# Patient Record
Sex: Male | Born: 1945 | Race: Black or African American | Hispanic: No | Marital: Married | Smoking: Never smoker
Health system: Southern US, Community
[De-identification: ages and names within clinical notes are randomized; demographics above are authoritative.]

## PROBLEM LIST (undated history)

## (undated) DIAGNOSIS — E7800 Pure hypercholesterolemia, unspecified: Secondary | ICD-10-CM

## (undated) DIAGNOSIS — IMO0001 Reserved for inherently not codable concepts without codable children: Secondary | ICD-10-CM

## (undated) DIAGNOSIS — I10 Essential (primary) hypertension: Secondary | ICD-10-CM

## (undated) DIAGNOSIS — N138 Other obstructive and reflux uropathy: Secondary | ICD-10-CM

## (undated) DIAGNOSIS — T7840XA Allergy, unspecified, initial encounter: Secondary | ICD-10-CM

## (undated) DIAGNOSIS — R0602 Shortness of breath: Secondary | ICD-10-CM

## (undated) DIAGNOSIS — N529 Male erectile dysfunction, unspecified: Secondary | ICD-10-CM

## (undated) DIAGNOSIS — C61 Malignant neoplasm of prostate: Secondary | ICD-10-CM

## (undated) DIAGNOSIS — K219 Gastro-esophageal reflux disease without esophagitis: Secondary | ICD-10-CM

## (undated) DIAGNOSIS — I499 Cardiac arrhythmia, unspecified: Secondary | ICD-10-CM

## (undated) DIAGNOSIS — N401 Enlarged prostate with lower urinary tract symptoms: Secondary | ICD-10-CM

## (undated) DIAGNOSIS — M549 Dorsalgia, unspecified: Secondary | ICD-10-CM

## (undated) DIAGNOSIS — IMO0002 Reserved for concepts with insufficient information to code with codable children: Secondary | ICD-10-CM

## (undated) HISTORY — DX: Reserved for concepts with insufficient information to code with codable children: IMO0002

## (undated) HISTORY — DX: Reserved for inherently not codable concepts without codable children: IMO0001

## (undated) HISTORY — PX: CIRCUMCISION: SUR203

---

## 1997-11-13 ENCOUNTER — Ambulatory Visit (HOSPITAL_COMMUNITY): Admission: RE | Admit: 1997-11-13 | Discharge: 1997-11-13 | Payer: Self-pay | Admitting: *Deleted

## 1998-01-03 ENCOUNTER — Ambulatory Visit (HOSPITAL_COMMUNITY): Admission: RE | Admit: 1998-01-03 | Discharge: 1998-01-03 | Payer: Self-pay | Admitting: *Deleted

## 1998-01-03 ENCOUNTER — Encounter: Payer: Self-pay | Admitting: *Deleted

## 1998-09-12 ENCOUNTER — Ambulatory Visit (HOSPITAL_COMMUNITY): Admission: RE | Admit: 1998-09-12 | Discharge: 1998-09-12 | Payer: Self-pay | Admitting: *Deleted

## 1998-09-12 ENCOUNTER — Encounter: Payer: Self-pay | Admitting: *Deleted

## 1998-11-08 ENCOUNTER — Encounter: Payer: Self-pay | Admitting: *Deleted

## 1998-11-08 ENCOUNTER — Ambulatory Visit (HOSPITAL_COMMUNITY): Admission: RE | Admit: 1998-11-08 | Discharge: 1998-11-08 | Payer: Self-pay | Admitting: *Deleted

## 1999-10-02 ENCOUNTER — Ambulatory Visit (HOSPITAL_COMMUNITY): Admission: RE | Admit: 1999-10-02 | Discharge: 1999-10-02 | Payer: Self-pay | Admitting: *Deleted

## 1999-10-02 ENCOUNTER — Encounter: Payer: Self-pay | Admitting: *Deleted

## 2000-04-19 ENCOUNTER — Encounter: Payer: Self-pay | Admitting: *Deleted

## 2000-04-19 ENCOUNTER — Ambulatory Visit (HOSPITAL_COMMUNITY): Admission: RE | Admit: 2000-04-19 | Discharge: 2000-04-19 | Payer: Self-pay | Admitting: *Deleted

## 2005-09-04 ENCOUNTER — Encounter: Admission: RE | Admit: 2005-09-04 | Discharge: 2005-09-04 | Payer: Self-pay | Admitting: Cardiology

## 2011-09-10 ENCOUNTER — Other Ambulatory Visit: Payer: Self-pay | Admitting: Internal Medicine

## 2011-09-10 ENCOUNTER — Encounter (HOSPITAL_COMMUNITY): Payer: Self-pay | Admitting: Emergency Medicine

## 2011-09-10 ENCOUNTER — Inpatient Hospital Stay (HOSPITAL_COMMUNITY)
Admission: EM | Admit: 2011-09-10 | Discharge: 2011-09-12 | DRG: 379 | Disposition: A | Discharge status: Home / Self Care | Payer: 59 | Attending: Internal Medicine | Admitting: Internal Medicine

## 2011-09-10 DIAGNOSIS — D649 Anemia, unspecified: Secondary | ICD-10-CM | POA: Diagnosis present

## 2011-09-10 DIAGNOSIS — K219 Gastro-esophageal reflux disease without esophagitis: Secondary | ICD-10-CM | POA: Diagnosis present

## 2011-09-10 DIAGNOSIS — K449 Diaphragmatic hernia without obstruction or gangrene: Secondary | ICD-10-CM | POA: Diagnosis present

## 2011-09-10 DIAGNOSIS — K2970 Gastritis, unspecified, without bleeding: Secondary | ICD-10-CM | POA: Diagnosis present

## 2011-09-10 DIAGNOSIS — D500 Iron deficiency anemia secondary to blood loss (chronic): Secondary | ICD-10-CM | POA: Diagnosis present

## 2011-09-10 DIAGNOSIS — I10 Essential (primary) hypertension: Secondary | ICD-10-CM | POA: Diagnosis present

## 2011-09-10 DIAGNOSIS — Z833 Family history of diabetes mellitus: Secondary | ICD-10-CM | POA: Diagnosis not present

## 2011-09-10 DIAGNOSIS — E119 Type 2 diabetes mellitus without complications: Secondary | ICD-10-CM | POA: Diagnosis not present

## 2011-09-10 DIAGNOSIS — R112 Nausea with vomiting, unspecified: Secondary | ICD-10-CM

## 2011-09-10 DIAGNOSIS — Z880 Allergy status to penicillin: Secondary | ICD-10-CM | POA: Diagnosis not present

## 2011-09-10 DIAGNOSIS — E7800 Pure hypercholesterolemia, unspecified: Secondary | ICD-10-CM | POA: Diagnosis present

## 2011-09-10 DIAGNOSIS — R0602 Shortness of breath: Secondary | ICD-10-CM | POA: Diagnosis not present

## 2011-09-10 DIAGNOSIS — K921 Melena: Secondary | ICD-10-CM | POA: Diagnosis present

## 2011-09-10 DIAGNOSIS — K922 Gastrointestinal hemorrhage, unspecified: Secondary | ICD-10-CM

## 2011-09-10 HISTORY — DX: Dorsalgia, unspecified: M54.9

## 2011-09-10 HISTORY — DX: Shortness of breath: R06.02

## 2011-09-10 HISTORY — DX: Essential (primary) hypertension: I10

## 2011-09-10 HISTORY — DX: Gastro-esophageal reflux disease without esophagitis: K21.9

## 2011-09-10 HISTORY — DX: Reserved for inherently not codable concepts without codable children: IMO0001

## 2011-09-10 HISTORY — DX: Cardiac arrhythmia, unspecified: I49.9

## 2011-09-10 LAB — PROTIME-INR
INR: 1.04 (ref 0.00–1.49)
Prothrombin Time: 13.8 seconds (ref 11.6–15.2)

## 2011-09-10 LAB — COMPREHENSIVE METABOLIC PANEL
ALT: 11 U/L (ref 0–53)
AST: 12 U/L (ref 0–37)
CO2: 23 mEq/L (ref 19–32)
Calcium: 9.5 mg/dL (ref 8.4–10.5)
GFR calc non Af Amer: 40 mL/min — ABNORMAL LOW (ref >90)
Potassium: 4.2 mEq/L (ref 3.5–5.1)
Sodium: 138 mEq/L (ref 135–145)
Total Protein: 7.6 g/dL (ref 6.0–8.3)

## 2011-09-10 LAB — RETICULOCYTES
Retic Count, Absolute: 86 10*3/uL (ref 19.0–186.0)
Retic Ct Pct: 3.4 % — ABNORMAL HIGH (ref 0.4–3.1)

## 2011-09-10 LAB — PREPARE RBC (CROSSMATCH): Order Confirmation: POSITIVE

## 2011-09-10 LAB — APTT: aPTT: 31 seconds (ref 24–37)

## 2011-09-10 LAB — CBC
HCT: 20.6 % — ABNORMAL LOW (ref 39.0–52.0)
Hemoglobin: 6.6 g/dL — CL (ref 13.0–17.0)
MCH: 25.7 pg — ABNORMAL LOW (ref 26.0–34.0)
MCHC: 32 g/dL (ref 30.0–36.0)

## 2011-09-10 LAB — ABO/RH: ABO/RH(D): O POS

## 2011-09-10 LAB — GLUCOSE, CAPILLARY: Glucose-Capillary: 128 mg/dL — ABNORMAL HIGH (ref 70–99)

## 2011-09-10 MED ORDER — ONDANSETRON HCL 4 MG PO TABS: 4.0000 mg | ORAL_TABLET | Freq: Four times a day (QID) | ORAL | Status: DC | PRN

## 2011-09-10 MED ORDER — SODIUM CHLORIDE 0.9 % IV SOLN
80.0000 mg | Freq: Once | INTRAVENOUS | Status: DC
  Filled 2011-09-10: qty 80

## 2011-09-10 MED ORDER — SODIUM CHLORIDE 0.9 % IJ SOLN
INTRAMUSCULAR | Status: AC
  Administered 2011-09-10: 10 mL
  Filled 2011-09-10: qty 10

## 2011-09-10 MED ORDER — ALBUTEROL SULFATE (5 MG/ML) 0.5% IN NEBU: 2.5000 mg | INHALATION_SOLUTION | RESPIRATORY_TRACT | Status: DC | PRN

## 2011-09-10 MED ORDER — ACETAMINOPHEN 650 MG RE SUPP: 650.0000 mg | Freq: Four times a day (QID) | RECTAL | Status: DC | PRN

## 2011-09-10 MED ORDER — SODIUM CHLORIDE 0.9 % IV SOLN
INTRAVENOUS | Status: AC
  Administered 2011-09-11: 04:00:00 via INTRAVENOUS

## 2011-09-10 MED ORDER — ONDANSETRON HCL 4 MG/2ML IJ SOLN: 4.0000 mg | Freq: Four times a day (QID) | INTRAMUSCULAR | Status: DC | PRN

## 2011-09-10 MED ORDER — SODIUM CHLORIDE 0.9 % IJ SOLN
3.0000 mL | Freq: Two times a day (BID) | INTRAMUSCULAR | Status: DC
  Administered 2011-09-10 – 2011-09-11 (×2): 3 mL via INTRAVENOUS

## 2011-09-10 MED ORDER — ACETAMINOPHEN 325 MG PO TABS: 650.0000 mg | ORAL_TABLET | Freq: Four times a day (QID) | ORAL | Status: DC | PRN

## 2011-09-10 MED ORDER — SODIUM CHLORIDE 0.9 % IV BOLUS (SEPSIS)
500.0000 mL | Freq: Once | INTRAVENOUS | Status: AC
  Administered 2011-09-10: 500 mL via INTRAVENOUS

## 2011-09-10 MED ORDER — ACETAMINOPHEN 325 MG PO TABS
650.0000 mg | ORAL_TABLET | Freq: Once | ORAL | Status: AC
  Administered 2011-09-10: 650 mg via ORAL
  Filled 2011-09-10: qty 2

## 2011-09-10 MED ORDER — HYDROCODONE-ACETAMINOPHEN 5-325 MG PO TABS: 1.0000 | ORAL_TABLET | ORAL | Status: DC | PRN

## 2011-09-10 MED ORDER — PANTOPRAZOLE SODIUM 40 MG IV SOLR
40.0000 mg | Freq: Two times a day (BID) | INTRAVENOUS | Status: DC
  Administered 2011-09-10 – 2011-09-11 (×2): 40 mg via INTRAVENOUS
  Filled 2011-09-10 (×6): qty 40

## 2011-09-10 MED ORDER — SODIUM CHLORIDE 0.9 % IV SOLN
500.0000 mL | Freq: Once | INTRAVENOUS | Status: AC
  Administered 2011-09-10: 500 mL via INTRAVENOUS

## 2011-09-10 MED ORDER — DIPHENHYDRAMINE HCL 25 MG PO CAPS
25.0000 mg | ORAL_CAPSULE | Freq: Once | ORAL | Status: AC
  Administered 2011-09-10: 25 mg via ORAL
  Filled 2011-09-10: qty 1

## 2011-09-10 MED ORDER — INSULIN ASPART 100 UNIT/ML ~~LOC~~ SOLN
0.0000 [IU] | SUBCUTANEOUS | Status: DC
  Administered 2011-09-11: 1 [IU] via SUBCUTANEOUS

## 2011-09-10 NOTE — ED Provider Notes (Signed)
I saw and evaluated the patient, reviewed the resident's note and I agree with the findings and plan.   .Face to face Exam:  General:  Awake HEENT:  Atraumatic Resp:  Normal effort Abd:  Nondistended Neuro:No focal weakness Lymph: No adenopathy   Nelia Shi, MD 09/10/11 2025

## 2011-09-10 NOTE — H&P (Signed)
PCP:  Renford Dills Urologist: Audry Riles GI: Gershon Crane  Chief Complaint:   Low Hg  HPI: Christian Estrada is a 66 y.o. male   has a past medical history of Reflux; Hypertension; Diabetes mellitus; and Back pain.   Presented with  Hx of worsening SOB for 2 week he presented to his PCP and routine labs showed Hg of 6 he was told to come ER. NO chest pain, but has had DOE, no blood in stool but have had some black stool for 2 weeks. Stays he has been tired and lightheaded lately.  NO epigastric pain.  He has been using Aleve last month as needed for back pain. He does take daily aspirin 81 mg.  He had colonoscopy done 5 years ago and was about to have another done but had to reschedule.   Review of Systems:    Pertinent positives include: melena,nausea, vomiting, Palor, weight loss of 8 lb intentional   Constitutional:  No weight loss, night sweats, Fevers, chills, fatigue,  HEENT:  No headaches, Difficulty swallowing,Tooth/dental problems,Sore throat,  No sneezing, itching, ear ache, nasal congestion, post nasal drip,  Cardio-vascular:  No chest pain, Orthopnea, PND, anasarca, dizziness, palpitations.no Bilateral lower extremity swelling  GI:  No heartburn, indigestion, abdominal pain, , diarrhea, change in bowel habits, loss of appetite,  blood in stool, hematemesis Resp:  no shortness of breath at rest. No dyspnea on exertion, No excess mucus, no productive cough, No non-productive cough, No coughing up of blood.No change in color of mucus.No wheezing. Skin:  no rash or lesions. No jaundice GU:  no dysuria, change in color of urine, no urgency or frequency. No straining to urinate.  No flank pain.  Musculoskeletal:  No joint pain or no joint swelling. No decreased range of motion. No back pain.  Psych:  No change in mood or affect. No depression or anxiety. No memory loss.  Neuro: no localizing neurological complaints, no tingling, no weakness, no double vision, no gait  abnormality, no slurred speech, no confusion  Otherwise ROS are negative except for above, 10 systems were reviewed  Past Medical History: Past Medical History  Diagnosis Date  . Reflux   . Hypertension   . Diabetes mellitus   . Back pain    History reviewed. No pertinent past surgical history.   Medications: Prior to Admission medications   Medication Sig Start Date End Date Taking? Authorizing Provider  amlodipine-atorvastatin (CADUET) 10-20 MG per tablet Take 1 tablet by mouth daily.   Yes Historical Provider, MD  Multiple Vitamin (MULTIVITAMIN WITH MINERALS) TABS Take 1 tablet by mouth every morning.   Yes Historical Provider, MD  Omega-3 Fatty Acids (FISH OIL PO) Take 1 capsule by mouth every morning.   Yes Historical Provider, MD  Pioglitazone HCl-Metformin HCl (ACTOPLUS MET XR) 30-1000 MG TB24 Take 1 tablet by mouth daily with supper.   Yes Historical Provider, MD  Ranitidine HCl (ZANTAC PO) Take 1 tablet by mouth daily.   Yes Historical Provider, MD  sitaGLIPtin (JANUVIA) 100 MG tablet Take 100 mg by mouth daily.   Yes Historical Provider, MD  valsartan-hydrochlorothiazide (DIOVAN-HCT) 320-12.5 MG per tablet Take 1 tablet by mouth daily.   Yes Historical Provider, MD    Allergies:   Allergies  Allergen Reactions  . Penicillins Other (See Comments)    Unknown-reaction years ago    Social History:  Ambulatory  independently  Lives at  Home with family   reports that he has never smoked. He does  not have any smokeless tobacco history on file. He reports that he does not drink alcohol or use illicit drugs.   Family History: family history includes Diabetes in his mother.    Physical Exam: Patient Vitals for the past 24 hrs:  BP Temp Temp src Pulse Resp SpO2  09/10/11 1715 117/50 mmHg 97.9 F (36.6 C) Oral 95  18  100 %    1. General:  in No Acute distress 2. Psychological: Alert and  Oriented 3. Head/ENT:   Moist  Mucous Membranes                           Head Non traumatic, neck supple                          Normal  Dentition                           Pale mucosa 4. SKIN: normal Skin turgor,  Skin clean Dry and intact no rash 5. Heart: Regular rate and rhythm no Murmur, Rub or gallop 6. Lungs: Clear to auscultation bilaterally, no wheezes or crackles   7. Abdomen: Soft, non-tender, Non distended 8. Lower extremities: no clubbing, cyanosis, or edema 9. Neurologically Grossly intact, moving all 4 extremities equally 10. MSK: Normal range of motion  body mass index is unknown because there is no height or weight on file.   Labs on Admission:   [facility] 09/10/11 1813  NA 138  K 4.2  CL 104  CO2 23  GLUCOSE 134*  BUN 44*  CREATININE 1.73*  CALCIUM 9.5  MG --  PHOS --    Basename 09/10/11 1813  AST 12  ALT 11  ALKPHOS 69  BILITOT 0.2*  PROT 7.6  ALBUMIN 3.7   No results found for this basename: LIPASE:2,AMYLASE:2 in the last 72 hours  Basename 09/10/11 1813  WBC 10.7*  NEUTROABS --  HGB 6.6*  HCT 20.6*  MCV 80.2  PLT 337   No results found for this basename: CKTOTAL:3,CKMB:3,CKMBINDEX:3,TROPONINI:3 in the last 72 hours No results found for this basename: TSH,T4TOTAL,FREET3,T3FREE,THYROIDAB in the last 72 hours  Basename 09/10/11 1939  VITAMINB12 --  FOLATE --  FERRITIN --  TIBC --  IRON --  RETICCTPCT 3.4*   No results found for this basename: HGBA1C    CrCl is unknown because there is no height on file for the current visit. ABG No results found for this basename: phart, pco2, po2, hco3, tco2, acidbasedef, o2sat     No results found for this basename: DDIMER     Other results:  I have pearsonaly reviewed this: ECG REPORT  Rate: 92  Rhythm: 1st degree AV block synus ST&T Change: no ischemia   Cultures: No results found for this basename: sdes, specrequest, cult, reptstatus       Radiological Exams on Admission: No results found.  Chart has been reviewed  Assessment/Plan  66 yo w no  prior hx presents with melena and anemia w hx of NSAID use.   Present on Admission:  .Anemia  - we'll transfuse 2 units follow up closely CBC, monitor and step down  .Blood in stool - spoke to [facility] who will see him in the morning, Protonix 40 twice a day. Likely upper GI source  .Diabetes mellitus - hold home medications while n.p.o., sliding scale  .HTN (hypertension) - hold home medications  while soft blood pressures restart when blood pressure improved   Prophylaxis: SCD  Protonix  CODE STATUS: FULL CODE  Other plan as per orders.  I have spent a total of 60 min on this admission, spoke to family in details about the plan also spoke to consultant  Malyiah Fellows 09/10/2011, 8:24 PM

## 2011-09-10 NOTE — ED Provider Notes (Signed)
History     CSN: [id number]  Arrival date & time 09/10/11  1703   First MD Initiated Contact with Patient 09/10/11 1801      Chief Complaint  Patient presents with  . Low Hemoglobin     (Consider location/radiation/quality/duration/timing/severity/associated sxs/prior treatment) Patient is a 66 y.o. male presenting with general illness.  Illness  The current episode started more than 1 week ago. The onset was gradual. The problem occurs continuously. The problem has been gradually worsening. The problem is moderate. Nothing relieves the symptoms. The symptoms are aggravated by activity. Pertinent negatives include no fever, no abdominal pain, no constipation, no diarrhea, no nausea, no vomiting, no congestion, no headaches, no rhinorrhea, no cough and no URI.    Past Medical History  Diagnosis Date  . Reflux   . Hypertension   . Diabetes mellitus     History reviewed. No pertinent past surgical history.  History reviewed. No pertinent family history.  History  Substance Use Topics  . Smoking status: Never Smoker   . Smokeless tobacco: Not on file  . Alcohol Use: No      Review of Systems  Constitutional: Positive for fatigue. Negative for fever and chills.  HENT: Negative for congestion and rhinorrhea.   Respiratory: Positive for shortness of breath (exertional). Negative for cough.   Cardiovascular: Negative for chest pain and leg swelling.  Gastrointestinal: Negative for nausea, vomiting, abdominal pain, diarrhea, constipation and blood in stool.  Genitourinary: Negative for dysuria and decreased urine volume.  Musculoskeletal: Negative.   Skin: Negative.   Neurological: Negative for syncope, numbness and headaches.  Psychiatric/Behavioral: Negative for confusion.  All other systems reviewed and are negative.    Allergies  Penicillins  Home Medications   Current Outpatient Rx  Name Route Sig Dispense Refill  . AMLODIPINE-ATORVASTATIN 10-20 MG PO TABS  Oral Take 1 tablet by mouth daily.    . ADULT MULTIVITAMIN W/MINERALS CH Oral Take 1 tablet by mouth every morning.    [name] FISH OIL PO Oral Take 1 capsule by mouth every morning.    [name] PIOGLITAZONE HCL-METFORMIN ER 30-1000 MG PO TB24 Oral Take 1 tablet by mouth daily with supper.    [name] ZANTAC PO Oral Take 1 tablet by mouth daily.    [name] SITAGLIPTIN PHOSPHATE 100 MG PO TABS Oral Take 100 mg by mouth daily.    [name] VALSARTAN-HYDROCHLOROTHIAZIDE 320-12.5 MG PO TABS Oral Take 1 tablet by mouth daily.      BP 117/50  Pulse 95  Temp 97.9 F (36.6 C) (Oral)  Resp 18  SpO2 100%  Physical Exam  Nursing note and vitals reviewed. Constitutional: He is oriented to person, place, and time. He appears well-developed and well-nourished.  HENT:  Head: Normocephalic and atraumatic.  Right Ear: External ear normal.  Left Ear: External ear normal.  Nose: Nose normal.  Neck: Neck supple.  Cardiovascular: Normal rate, regular rhythm, normal heart sounds and intact distal pulses.   Pulmonary/Chest: Effort normal and breath sounds normal. He has no wheezes. He has no rales.  Abdominal: Soft. He exhibits no distension. There is no tenderness.  Musculoskeletal: He exhibits no edema.  Lymphadenopathy:    He has no cervical adenopathy.  Neurological: He is alert and oriented to person, place, and time.  Skin: Skin is warm and dry.    ED Course  Procedures (including critical care time)  Labs Reviewed  COMPREHENSIVE METABOLIC PANEL - Abnormal; Notable for the following:    Glucose, Bld 134 (*)  BUN 44 (*)     Creatinine, Ser 1.73 (*)     Total Bilirubin 0.2 (*)     GFR calc non Af Amer 40 (*)     GFR calc Af Amer 46 (*)     All other components within normal limits  CBC - Abnormal; Notable for the following:    WBC 10.7 (*)     RBC 2.57 (*)     Hemoglobin 6.6 (*)     HCT 20.6 (*)     MCH 25.7 (*)     RDW 15.9 (*)     All other components within normal limits  RETICULOCYTES - Abnormal; Notable  for the following:    Retic Ct Pct 3.4 (*)     RBC. 2.53 (*)     All other components within normal limits  TYPE AND SCREEN  PROTIME-INR  APTT  ABO/RH  PREPARE RBC (CROSSMATCH)  VITAMIN B12  FOLATE  IRON AND TIBC  FERRITIN   No results found.   Date: 09/10/2011  Rate: 92  Rhythm: normal sinus rhythm  QRS Axis: normal  Intervals: normal  ST/T Wave abnormalities: nonspecific T wave changes  Conduction Disutrbances:none  Narrative Interpretation:   Old EKG Reviewed: none available   1. Anemia   2. GI bleed       MDM  66 yo male with worsening fatigue and exertional dyspnea over past few weeks. Hgb of 6 at PCP's, sent to ED. HDS, lowest BP 112, no tachycardia (no beta blocker). Heme + stool with dark stool. Will admit to medicine in step down, given protonix IV bolus without drip (no upper symptoms at this time). Will send anemia panel, and transfuse/type and cross        Pricilla Loveless, MD 09/10/11 2022

## 2011-09-10 NOTE — ED Notes (Addendum)
Pt reports went to Dr Cheri Guppy labs drawn, and was told his Hgb was low at 6; pt denies any blood in stool or vomiting any blood--unsure of where blood loss is  Coming from; pt does report feeling dizzy and lightheaded for past 2 weeks; currently states he feels fine

## 2011-09-11 ENCOUNTER — Encounter (HOSPITAL_COMMUNITY): Payer: Self-pay | Admitting: *Deleted

## 2011-09-11 ENCOUNTER — Encounter (HOSPITAL_COMMUNITY)
Admission: EM | Disposition: A | Discharge status: Home / Self Care | Payer: Self-pay | Source: Home / Self Care | Attending: Internal Medicine

## 2011-09-11 HISTORY — PX: ESOPHAGOGASTRODUODENOSCOPY: SHX5428

## 2011-09-11 LAB — COMPREHENSIVE METABOLIC PANEL
AST: 12 U/L (ref 0–37)
Albumin: 3.3 g/dL — ABNORMAL LOW (ref 3.5–5.2)
Alkaline Phosphatase: 59 U/L (ref 39–117)
BUN: 31 mg/dL — ABNORMAL HIGH (ref 6–23)
Potassium: 4.2 mEq/L (ref 3.5–5.1)
Sodium: 141 mEq/L (ref 135–145)
Total Protein: 7 g/dL (ref 6.0–8.3)

## 2011-09-11 LAB — CBC
HCT: 24.5 % — ABNORMAL LOW (ref 39.0–52.0)
HCT: 26.5 % — ABNORMAL LOW (ref 39.0–52.0)
Hemoglobin: 8.5 g/dL — ABNORMAL LOW (ref 13.0–17.0)
MCH: 26.3 pg (ref 26.0–34.0)
MCH: 26.6 pg (ref 26.0–34.0)
MCHC: 33.1 g/dL (ref 30.0–36.0)
MCHC: 32.1 g/dL (ref 30.0–36.0)
MCHC: 32.5 g/dL (ref 30.0–36.0)
MCV: 82 fL (ref 78.0–100.0)
MCV: 82 fL (ref 78.0–100.0)
Platelets: 307 10*3/uL (ref 150–400)
Platelets: 342 10*3/uL (ref 150–400)
RDW: 16.2 % — ABNORMAL HIGH (ref 11.5–15.5)
RDW: 16.1 % — ABNORMAL HIGH (ref 11.5–15.5)
RDW: 16.2 % — ABNORMAL HIGH (ref 11.5–15.5)
WBC: 8.1 10*3/uL (ref 4.0–10.5)

## 2011-09-11 LAB — FERRITIN: Ferritin: <1 ng/mL — ABNORMAL LOW (ref 22–322)

## 2011-09-11 LAB — GLUCOSE, CAPILLARY: Glucose-Capillary: 129 mg/dL — ABNORMAL HIGH (ref 70–99)

## 2011-09-11 LAB — HEMOGLOBIN A1C: Hgb A1c MFr Bld: 6.5 % — ABNORMAL HIGH (ref <5.7)

## 2011-09-11 SURGERY — EGD (ESOPHAGOGASTRODUODENOSCOPY)
Anesthesia: Moderate Sedation

## 2011-09-11 MED ORDER — DIPHENHYDRAMINE HCL 50 MG/ML IJ SOLN
INTRAMUSCULAR | Status: DC | PRN
  Administered 2011-09-11: 25 mg via INTRAVENOUS

## 2011-09-11 MED ORDER — FENTANYL CITRATE 0.05 MG/ML IJ SOLN
INTRAMUSCULAR | Status: DC | PRN
  Administered 2011-09-11: 25 ug via INTRAVENOUS

## 2011-09-11 MED ORDER — FENTANYL CITRATE 0.05 MG/ML IJ SOLN
INTRAMUSCULAR | Status: AC
  Filled 2011-09-11: qty 2

## 2011-09-11 MED ORDER — SODIUM CHLORIDE 0.9 % IV SOLN: INTRAVENOUS | Status: DC

## 2011-09-11 MED ORDER — INSULIN ASPART 100 UNIT/ML ~~LOC~~ SOLN
0.0000 [IU] | Freq: Three times a day (TID) | SUBCUTANEOUS | Status: DC
  Administered 2011-09-12: 1 [IU] via SUBCUTANEOUS

## 2011-09-11 MED ORDER — BUTAMBEN-TETRACAINE-BENZOCAINE 2-2-14 % EX AERO
INHALATION_SPRAY | CUTANEOUS | Status: DC | PRN
  Administered 2011-09-11: 2 via TOPICAL

## 2011-09-11 MED ORDER — MIDAZOLAM HCL 10 MG/2ML IJ SOLN
INTRAMUSCULAR | Status: DC | PRN
  Administered 2011-09-11 (×2): 2 mg via INTRAVENOUS

## 2011-09-11 MED ORDER — MIDAZOLAM HCL 10 MG/2ML IJ SOLN
INTRAMUSCULAR | Status: AC
  Filled 2011-09-11: qty 2

## 2011-09-11 MED ORDER — DIPHENHYDRAMINE HCL 50 MG/ML IJ SOLN
INTRAMUSCULAR | Status: AC
  Filled 2011-09-11: qty 1

## 2011-09-11 NOTE — Progress Notes (Signed)
Patient transferred to unit 5500 per MD order, Report called to Alona Bene, RN, patient traveled in wheel chair all vital signs WNL on transfer.

## 2011-09-11 NOTE — Consult Note (Signed)
[facility] Consult Note  Referring Provider: No ref. provider found Primary Care Physician:  No primary provider on file. Primary Gastroenterologist:  Dr.  Antony Contras Complaint: Black stools and weakness HPI: Christian Estrada is an 66 y.o. black male  who presented to his primary care doctor with dyspnea and weakness and was found to have a hemoglobin of 6. He was noted to have dark heme positive stools and had noted dark stools for about 2 weeks. Denies any nausea vomiting or abdominal pain. He had a colonoscopy 5 years ago and was mostly due for one this year. He has also had a recently elevated PSA and is scheduled to have a prostate biopsy. He has been using Aleve for back problems but not much in the last month.  Past Medical History  Diagnosis Date  . Reflux   . Hypertension   . Diabetes mellitus   . Back pain   . Dysrhythmia   . Shortness of breath   . GERD (gastroesophageal reflux disease)     History reviewed. No pertinent past surgical history.  Medications Prior to Admission  Medication Sig Dispense Refill  . amlodipine-atorvastatin (CADUET) 10-20 MG per tablet Take 1 tablet by mouth daily.      . Multiple Vitamin (MULTIVITAMIN WITH MINERALS) TABS Take 1 tablet by mouth every morning.      . Omega-3 Fatty Acids (FISH OIL PO) Take 1 capsule by mouth every morning.      . Pioglitazone HCl-Metformin HCl (ACTOPLUS MET XR) 30-1000 MG TB24 Take 1 tablet by mouth daily with supper.      . Ranitidine HCl (ZANTAC PO) Take 1 tablet by mouth daily.      . sitaGLIPtin (JANUVIA) 100 MG tablet Take 100 mg by mouth daily.      . valsartan-hydrochlorothiazide (DIOVAN-HCT) 320-12.5 MG per tablet Take 1 tablet by mouth daily.        Allergies:  Allergies  Allergen Reactions  . Penicillins Other (See Comments)    Unknown-reaction years ago    Family History  Problem Relation Age of Onset  . Diabetes Mother     Social History:  reports that he has never smoked. He  does not have any smokeless tobacco history on file. He reports that he does not drink alcohol or use illicit drugs.  Review of Systems: negative except as above   Blood pressure 110/58, pulse 90, temperature 98.2 F (36.8 C), temperature source Oral, resp. rate 18, height 6' (1.829 m), weight 117.1 kg (258 lb 2.5 oz), SpO2 96.00%. Head: Normocephalic, without obvious abnormality, atraumatic Neck: no adenopathy, no carotid bruit, no JVD, supple, symmetrical, trachea midline and thyroid not enlarged, symmetric, no tenderness/mass/nodules Resp: clear to auscultation bilaterally Cardio: regular rate and rhythm, S1, S2 normal, no murmur, click, rub or gallop GI: Abdomen soft, nondistended with normoactive bowel sounds. No hepatomegaly masses or guarding. Extremities: extremities normal, atraumatic, no cyanosis or edema  Results for orders placed during the hospital encounter of 09/10/11 (from the past 48 hour(s))  COMPREHENSIVE METABOLIC PANEL     Status: Abnormal   Collection Time   09/10/11  6:13 PM      Component Value Range Comment   Sodium 138  135 - 145 mEq/L    Potassium 4.2  3.5 - 5.1 mEq/L    Chloride 104  96 - 112 mEq/L    CO2 23  19 - 32 mEq/L    Glucose, Bld 134 (*) 70 - 99 mg/dL  BUN 44 (*) 6 - 23 mg/dL    Creatinine, Ser 1.61 (*) 0.50 - 1.35 mg/dL    Calcium 9.5  8.4 - 09.6 mg/dL    Total Protein 7.6  6.0 - 8.3 g/dL    Albumin 3.7  3.5 - 5.2 g/dL    AST 12  0 - 37 U/L    ALT 11  0 - 53 U/L    Alkaline Phosphatase 69  39 - 117 U/L    Total Bilirubin 0.2 (*) 0.3 - 1.2 mg/dL    GFR calc non Af Amer 40 (*) >90 mL/min    GFR calc Af Amer 46 (*) >90 mL/min   CBC     Status: Abnormal   Collection Time   09/10/11  6:13 PM      Component Value Range Comment   WBC 10.7 (*) 4.0 - 10.5 K/uL    RBC 2.57 (*) 4.22 - 5.81 MIL/uL    Hemoglobin 6.6 (*) 13.0 - 17.0 g/dL    HCT 04.5 (*) 40.9 - 52.0 %    MCV 80.2  78.0 - 100.0 fL    MCH 25.7 (*) 26.0 - 34.0 pg    MCHC 32.0  30.0 -  36.0 g/dL    RDW 81.1 (*) 91.4 - 15.5 %    Platelets 337  150 - 400 K/uL   PROTIME-INR     Status: Normal   Collection Time   09/10/11  6:16 PM      Component Value Range Comment   Prothrombin Time 13.8  11.6 - 15.2 seconds    INR 1.04  0.00 - 1.49   APTT     Status: Normal   Collection Time   09/10/11  6:16 PM      Component Value Range Comment   aPTT 31  24 - 37 seconds   TYPE AND SCREEN     Status: Normal (Preliminary result)   Collection Time   09/10/11  6:25 PM      Component Value Range Comment   ABO/RH(D) O POS      Antibody Screen NEG      Sample Expiration 09/13/2011      Unit Number [id number]      Blood Component Type RED CELLS,LR      Unit division 00      Status of Unit ISSUED      Transfusion Status OK TO TRANSFUSE      Crossmatch Result Compatible      Unit Number [id number]      Blood Component Type RBC LR PHER1      Unit division 00      Status of Unit ISSUED,FINAL      Transfusion Status OK TO TRANSFUSE      Crossmatch Result Compatible     ABO/RH     Status: Normal   Collection Time   09/10/11  6:25 PM      Component Value Range Comment   ABO/RH(D) O POS     VITAMIN B12     Status: Normal   Collection Time   09/10/11  7:39 PM      Component Value Range Comment   Vitamin B-12 604  211 - 911 pg/mL   FOLATE     Status: Normal   Collection Time   09/10/11  7:39 PM      Component Value Range Comment   Folate >20.0     IRON AND TIBC     Status: Abnormal   Collection  Time   09/10/11  7:39 PM      Component Value Range Comment   Iron 13 (*) 42 - 135 ug/dL    TIBC 409  811 - 914 ug/dL    Saturation Ratios 3 (*) 20 - 55 %    UIBC 376  125 - 400 ug/dL   FERRITIN     Status: Abnormal   Collection Time   09/10/11  7:39 PM      Component Value Range Comment   Ferritin <1 (*) 22 - 322 ng/mL   RETICULOCYTES     Status: Abnormal   Collection Time   09/10/11  7:39 PM      Component Value Range Comment   Retic Ct Pct 3.4 (*) 0.4 - 3.1 %    RBC. 2.53 (*) 4.22 -  5.81 MIL/uL    Retic Count, Manual 86.0  19.0 - 186.0 K/uL   PREPARE RBC (CROSSMATCH)     Status: Normal   Collection Time   09/10/11  8:00 PM      Component Value Range Comment   Order Confirmation ORDER PROCESSED BY BLOOD BANK POS     GLUCOSE, CAPILLARY     Status: Abnormal   Collection Time   09/10/11  9:28 PM      Component Value Range Comment   Glucose-Capillary 128 (*) 70 - 99 mg/dL   PREPARE RBC (CROSSMATCH)     Status: Normal   Collection Time   09/10/11 10:00 PM      Component Value Range Comment   Order Confirmation ORDER PROCESSED BY BLOOD BANK     MRSA PCR SCREENING     Status: Normal   Collection Time   09/10/11 10:21 PM      Component Value Range Comment   MRSA by PCR NEGATIVE  NEGATIVE   GLUCOSE, CAPILLARY     Status: Abnormal   Collection Time   09/10/11 11:36 PM      Component Value Range Comment   Glucose-Capillary 119 (*) 70 - 99 mg/dL    Comment 1 Notify RN     CBC     Status: Abnormal   Collection Time   09/11/11  5:30 AM      Component Value Range Comment   WBC 8.1  4.0 - 10.5 K/uL    RBC 2.96 (*) 4.22 - 5.81 MIL/uL    Hemoglobin 8.1 (*) 13.0 - 17.0 g/dL    HCT 78.2 (*) 95.6 - 52.0 %    MCV 82.8  78.0 - 100.0 fL    MCH 27.4  26.0 - 34.0 pg    MCHC 33.1  30.0 - 36.0 g/dL    RDW 21.3 (*) 08.6 - 15.5 %    Platelets 307  150 - 400 K/uL   COMPREHENSIVE METABOLIC PANEL     Status: Abnormal   Collection Time   09/11/11  5:30 AM      Component Value Range Comment   Sodium 141  135 - 145 mEq/L    Potassium 4.2  3.5 - 5.1 mEq/L    Chloride 108  96 - 112 mEq/L    CO2 23  19 - 32 mEq/L    Glucose, Bld 110 (*) 70 - 99 mg/dL    BUN 31 (*) 6 - 23 mg/dL    Creatinine, Ser 5.78  0.50 - 1.35 mg/dL    Calcium 9.0  8.4 - 46.9 mg/dL    Total Protein 7.0  6.0 - 8.3 g/dL  Albumin 3.3 (*) 3.5 - 5.2 g/dL    AST 12  0 - 37 U/L    ALT 9  0 - 53 U/L    Alkaline Phosphatase 59  39 - 117 U/L    Total Bilirubin 0.5  0.3 - 1.2 mg/dL    GFR calc non Af Amer 56 (*) >90 mL/min     GFR calc Af Amer 64 (*) >90 mL/min   MAGNESIUM     Status: Normal   Collection Time   09/11/11  5:30 AM      Component Value Range Comment   Magnesium 2.2  1.5 - 2.5 mg/dL   PHOSPHORUS     Status: Normal   Collection Time   09/11/11  5:30 AM      Component Value Range Comment   Phosphorus 4.2  2.3 - 4.6 mg/dL    No results found.  Assessment: GI bleeding with upper tract source suggested. Plan:  We'll proceed with EGD later this morning. Thandiwe Siragusa C 09/11/2011, 8:04 AM

## 2011-09-11 NOTE — Progress Notes (Signed)
Utilization review completed.  

## 2011-09-11 NOTE — Progress Notes (Signed)
Subjective: Patient pleasant, no apparent distress. Outpatient labs revealed critical hemoglobin, patient had complained of dyspnea on outpatient basis. Further history revealed dark stools. Admission was deemed necessary. Patient is status post transfusion, hemoglobin up to 8. Currently he has been seen by GI, plan for endoscopy. Patient's last colonoscopy was about 5 years ago it did show polyps. He had issues NSAIDs. There is no history of GI bleed  Objective: Vital signs in last 24 hours: Temp:  [97.7 F (36.5 C)-98.5 F (36.9 C)] 97.7 F (36.5 C) (08/09 0902) Pulse Rate:  [87-95] 90  (08/09 0357) Resp:  [16-24] 24  (08/09 0902) BP: (107-120)/(50-69) 120/66 mmHg (08/09 0902) SpO2:  [96 %-100 %] 98 % (08/09 0902) Weight:  [117.1 kg (258 lb 2.5 oz)] 117.1 kg (258 lb 2.5 oz) (08/08 2115) Weight change:  Last BM Date: 09/10/11  Intake/Output from previous day: 08/08 0701 - 08/09 0700 In: 2092 [P.O.:240; I.V.:725; Blood:627; IV Piggyback:500] Out: 1300 [Urine:1300] Intake/Output this shift:    General appearance: alert and cooperative Resp: clear to auscultation bilaterally Cardio: regular rate and rhythm, S1, S2 normal, no murmur, click, rub or gallop GI: soft, non-tender; bowel sounds normal; no masses,  no organomegaly Extremities: extremities normal, atraumatic, no cyanosis or edema  Lab Results:  Results for orders placed during the hospital encounter of 09/10/11 (from the past 24 hour(s))  COMPREHENSIVE METABOLIC PANEL     Status: Abnormal   Collection Time   09/10/11  6:13 PM      Component Value Range   Sodium 138  135 - 145 mEq/L   Potassium 4.2  3.5 - 5.1 mEq/L   Chloride 104  96 - 112 mEq/L   CO2 23  19 - 32 mEq/L   Glucose, Bld 134 (*) 70 - 99 mg/dL   BUN 44 (*) 6 - 23 mg/dL   Creatinine, Ser 4.09 (*) 0.50 - 1.35 mg/dL   Calcium 9.5  8.4 - 81.1 mg/dL   Total Protein 7.6  6.0 - 8.3 g/dL   Albumin 3.7  3.5 - 5.2 g/dL   AST 12  0 - 37 U/L   ALT 11  0 - 53 U/L   Alkaline Phosphatase 69  39 - 117 U/L   Total Bilirubin 0.2 (*) 0.3 - 1.2 mg/dL   GFR calc non Af Amer 40 (*) >90 mL/min   GFR calc Af Amer 46 (*) >90 mL/min  CBC     Status: Abnormal   Collection Time   09/10/11  6:13 PM      Component Value Range   WBC 10.7 (*) 4.0 - 10.5 K/uL   RBC 2.57 (*) 4.22 - 5.81 MIL/uL   Hemoglobin 6.6 (*) 13.0 - 17.0 g/dL   HCT 91.4 (*) 78.2 - 95.6 %   MCV 80.2  78.0 - 100.0 fL   MCH 25.7 (*) 26.0 - 34.0 pg   MCHC 32.0  30.0 - 36.0 g/dL   RDW 21.3 (*) 08.6 - 57.8 %   Platelets 337  150 - 400 K/uL  PROTIME-INR     Status: Normal   Collection Time   09/10/11  6:16 PM      Component Value Range   Prothrombin Time 13.8  11.6 - 15.2 seconds   INR 1.04  0.00 - 1.49  APTT     Status: Normal   Collection Time   09/10/11  6:16 PM      Component Value Range   aPTT 31  24 - 37 seconds  TYPE AND SCREEN     Status: Normal (Preliminary result)   Collection Time   09/10/11  6:25 PM      Component Value Range   ABO/RH(D) O POS     Antibody Screen NEG     Sample Expiration 09/13/2011     Unit Number [id number]     Blood Component Type RED CELLS,LR     Unit division 00     Status of Unit ISSUED     Transfusion Status OK TO TRANSFUSE     Crossmatch Result Compatible     Unit Number [id number]     Blood Component Type RBC LR PHER1     Unit division 00     Status of Unit ISSUED,FINAL     Transfusion Status OK TO TRANSFUSE     Crossmatch Result Compatible    ABO/RH     Status: Normal   Collection Time   09/10/11  6:25 PM      Component Value Range   ABO/RH(D) O POS    OCCULT BLOOD, POC DEVICE     Status: Normal   Collection Time   09/10/11  6:45 PM      Component Value Range   Fecal Occult Bld POSITIVE    VITAMIN B12     Status: Normal   Collection Time   09/10/11  7:39 PM      Component Value Range   Vitamin B-12 604  211 - 911 pg/mL  FOLATE     Status: Normal   Collection Time   09/10/11  7:39 PM      Component Value Range   Folate >20.0    IRON AND  TIBC     Status: Abnormal   Collection Time   09/10/11  7:39 PM      Component Value Range   Iron 13 (*) 42 - 135 ug/dL   TIBC 308  657 - 846 ug/dL   Saturation Ratios 3 (*) 20 - 55 %   UIBC 376  125 - 400 ug/dL  FERRITIN     Status: Abnormal   Collection Time   09/10/11  7:39 PM      Component Value Range   Ferritin <1 (*) 22 - 322 ng/mL  RETICULOCYTES     Status: Abnormal   Collection Time   09/10/11  7:39 PM      Component Value Range   Retic Ct Pct 3.4 (*) 0.4 - 3.1 %   RBC. 2.53 (*) 4.22 - 5.81 MIL/uL   Retic Count, Manual 86.0  19.0 - 186.0 K/uL  PREPARE RBC (CROSSMATCH)     Status: Normal   Collection Time   09/10/11  8:00 PM      Component Value Range   Order Confirmation ORDER PROCESSED BY BLOOD BANK POS    GLUCOSE, CAPILLARY     Status: Abnormal   Collection Time   09/10/11  9:28 PM      Component Value Range   Glucose-Capillary 128 (*) 70 - 99 mg/dL  PREPARE RBC (CROSSMATCH)     Status: Normal   Collection Time   09/10/11 10:00 PM      Component Value Range   Order Confirmation ORDER PROCESSED BY BLOOD BANK    MRSA PCR SCREENING     Status: Normal   Collection Time   09/10/11 10:21 PM      Component Value Range   MRSA by PCR NEGATIVE  NEGATIVE  GLUCOSE, CAPILLARY     Status: Abnormal  Collection Time   09/10/11 11:36 PM      Component Value Range   Glucose-Capillary 119 (*) 70 - 99 mg/dL   Comment 1 Notify RN    GLUCOSE, CAPILLARY     Status: Abnormal   Collection Time   09/11/11  4:06 AM      Component Value Range   Glucose-Capillary 112 (*) 70 - 99 mg/dL  CBC     Status: Abnormal   Collection Time   09/11/11  5:30 AM      Component Value Range   WBC 8.1  4.0 - 10.5 K/uL   RBC 2.96 (*) 4.22 - 5.81 MIL/uL   Hemoglobin 8.1 (*) 13.0 - 17.0 g/dL   HCT 16.1 (*) 09.6 - 04.5 %   MCV 82.8  78.0 - 100.0 fL   MCH 27.4  26.0 - 34.0 pg   MCHC 33.1  30.0 - 36.0 g/dL   RDW 40.9 (*) 81.1 - 91.4 %   Platelets 307  150 - 400 K/uL  COMPREHENSIVE METABOLIC PANEL     Status:  Abnormal   Collection Time   09/11/11  5:30 AM      Component Value Range   Sodium 141  135 - 145 mEq/L   Potassium 4.2  3.5 - 5.1 mEq/L   Chloride 108  96 - 112 mEq/L   CO2 23  19 - 32 mEq/L   Glucose, Bld 110 (*) 70 - 99 mg/dL   BUN 31 (*) 6 - 23 mg/dL   Creatinine, Ser 7.82  0.50 - 1.35 mg/dL   Calcium 9.0  8.4 - 95.6 mg/dL   Total Protein 7.0  6.0 - 8.3 g/dL   Albumin 3.3 (*) 3.5 - 5.2 g/dL   AST 12  0 - 37 U/L   ALT 9  0 - 53 U/L   Alkaline Phosphatase 59  39 - 117 U/L   Total Bilirubin 0.5  0.3 - 1.2 mg/dL   GFR calc non Af Amer 56 (*) >90 mL/min   GFR calc Af Amer 64 (*) >90 mL/min  MAGNESIUM     Status: Normal   Collection Time   09/11/11  5:30 AM      Component Value Range   Magnesium 2.2  1.5 - 2.5 mg/dL  PHOSPHORUS     Status: Normal   Collection Time   09/11/11  5:30 AM      Component Value Range   Phosphorus 4.2  2.3 - 4.6 mg/dL  GLUCOSE, CAPILLARY     Status: Abnormal   Collection Time   09/11/11  7:22 AM      Component Value Range   Glucose-Capillary 116 (*) 70 - 99 mg/dL  GLUCOSE, CAPILLARY     Status: Abnormal   Collection Time   09/11/11  9:10 AM      Component Value Range   Glucose-Capillary 114 (*) 70 - 99 mg/dL      Studies/Results: No results found.  Medications:  Prior to Admission:  Prescriptions prior to admission  Medication Sig Dispense Refill  . amlodipine-atorvastatin (CADUET) 10-20 MG per tablet Take 1 tablet by mouth daily.      . Multiple Vitamin (MULTIVITAMIN WITH MINERALS) TABS Take 1 tablet by mouth every morning.      . Omega-3 Fatty Acids (FISH OIL PO) Take 1 capsule by mouth every morning.      . Pioglitazone HCl-Metformin HCl (ACTOPLUS MET XR) 30-1000 MG TB24 Take 1 tablet by mouth daily with supper.      [name]  Ranitidine HCl (ZANTAC PO) Take 1 tablet by mouth daily.      . sitaGLIPtin (JANUVIA) 100 MG tablet Take 100 mg by mouth daily.      . valsartan-hydrochlorothiazide (DIOVAN-HCT) 320-12.5 MG per tablet Take 1 tablet by mouth  daily.       Scheduled:   . sodium chloride  500 mL Intravenous Once  . acetaminophen  650 mg Oral Once  . diphenhydrAMINE  25 mg Oral Once  . insulin aspart  0-9 Units Subcutaneous Q4H  . pantoprazole (PROTONIX) IV  40 mg Intravenous Q12H  . sodium chloride  500 mL Intravenous Once  . sodium chloride  3 mL Intravenous Q12H  . sodium chloride      . DISCONTD: pantoprazole (PROTONIX) IV  80 mg Intravenous Once   Continuous:   . sodium chloride 125 mL/hr at 09/11/11 0413  . sodium chloride    . sodium chloride      Assessment/Plan: Anemia secondary to GI bleed, patient is now status post 2 units packed red blood cells. Await results from endoscopy, saline lock IV fluids Hypertension Diabetes Hypercholesterolemia currently not on statin  Resume home meds pending results of GI evaluation.  LOS: 1 day   Christian Estrada D 09/11/2011, 9:30 AM

## 2011-09-11 NOTE — Progress Notes (Signed)
Subjective: See previous progress note  Objective: Vital signs in last 24 hours: Temp:  [97.5 F (36.4 C)-98.5 F (36.9 C)] 97.5 F (36.4 C) (08/09 1200) Pulse Rate:  [87-95] 87  (08/09 1140) Resp:  [12-33] 25  (08/09 1140) BP: (84-147)/(43-72) 107/62 mmHg (08/09 1200) SpO2:  [91 %-100 %] 93 % (08/09 1140) Weight:  [117.1 kg (258 lb 2.5 oz)] 117.1 kg (258 lb 2.5 oz) (08/08 2115) Weight change:  Last BM Date: 09/10/11  Intake/Output from previous day: 08/08 0701 - 08/09 0700 In: 2092 [P.O.:240; I.V.:725; Blood:627; IV Piggyback:500] Out: 1300 [Urine:1300] Intake/Output this shift: Total I/O In: 240 [P.O.:240] Out: -   see previous progress note  Lab Results:  Basename 09/11/11 1242 09/11/11 0530  WBC 9.2 8.1  HGB 8.5* 8.1*  HCT 26.5* 24.5*  PLT 334 307   BMET  Basename 09/11/11 0530 09/10/11 1813  NA 141 138  K 4.2 4.2  CL 108 104  CO2 23 23  GLUCOSE 110* 134*  BUN 31* 44*  CREATININE 1.31 1.73*  CALCIUM 9.0 9.5    Studies/Results: No results found.  Medications: I have reviewed the patient's current medications.  Assessment/Plan: See previous progress note, patient medically stable will transfer to a floor bed  LOS: 1 day   Imir Brumbach D 09/11/2011, 2:23 PM

## 2011-09-11 NOTE — Op Note (Signed)
[facility] [address] [place], [place]  [address]  ENDOSCOPY PROCEDURE REPORT  PATIENT:  Estrada, Christian  MR#:  [id number] BIRTHDATE:  [birth year], 66 yrs. old  GENDER:  male  ENDOSCOPIST:  Dorena Cookey Referred by:  PROCEDURE DATE:  09/11/2011 PROCEDURE: ASA CLASS: INDICATIONS: Anemia and heme positive stool  MEDICATIONS:  Fentanyl 12 and half micrograms Versed 4 mg Benadryl 25 mg TOPICAL ANESTHETIC:  DESCRIPTION OF PROCEDURE:   After the risks benefits and alternatives of the procedure were thoroughly explained, informed consent was obtained.  The Pentax Gastroscope [id number] endoscope was introduced through the mouth and advanced to the , without limitations.  The instrument was slowly withdrawn as the mucosa was fully examined. <<PROCEDUREIMAGES>> Esophagus: Small hiatal hernia otherwise normal Stomach: Minimal distal antral gastritis with no stigmata of hemorrhage Duodenum: Normal COMPLICATIONS:  None  ENDOSCOPIC IMPRESSION: Minimal hiatal hernia with no suggestion of upper GI bleeding source.  RECOMMENDATIONS: Will need colonoscopy at some point which could be done in hospital or as outpatient  REPEAT EXAM:  No  ______________________________ Dorena Cookey  CC:  n. eSIGNED:   Dorena Cookey at 09/11/2011 10:10 AM  Sheria Lang, [id number]

## 2011-09-11 NOTE — Progress Notes (Signed)
Patient went to Endo at 0837 in wheel chair, all vital signs WNL, pt returned from [facility] at 1050 in wheel chair, voided and all vital signs WNL. Family at bedside.

## 2011-09-12 LAB — TYPE AND SCREEN: Unit division: 0

## 2011-09-12 NOTE — Discharge Summary (Signed)
Physician Discharge Summary  Patient ID: Christian Estrada MRN: [id number] DOB/AGE: [birth year] 65 y.o.  Admit date: 09/10/2011 Discharge date: 09/12/2011  Admission Diagnoses: Upper GI bleed  Discharge Diagnoses:  Active Problems:  Anemia  Blood in stool  Diabetes mellitus  HTN (hypertension)   Discharged Condition: good  Hospital Course: The patient was seen in the office with shortness of breath and weakness and found to have a hemoglobin of 6.  He was admitted to the hospital and further history did reveal episodes of melanotic stools.  Approximately one month ago he had been taking NSAID for back pain which resolved after a new mattress. In hospital he received two units PRBC and hgb has come up to 8.5.  He was seen by Dr Dorena Cookey and upper endoscopy was normal.  He has had no further episodes of bleeding.  He will be evaluated further as an outpatient for a source.  Consults: GI  Significant Diagnostic Studies: endoscopy: gastroscopy: normal  Treatments: IV hydration and transfusion  Discharge Exam: Blood pressure 118/71, pulse 93, temperature 98.6 F (37 C), temperature source Oral, resp. rate 20, height 6' (1.829 m), weight 117.5 kg (259 lb 0.7 oz), SpO2 99.00%. Resp: clear to auscultation bilaterally Cardio: regular rate and rhythm, S1, S2 normal, no murmur, click, rub or gallop GI: soft, non-tender; bowel sounds normal; no masses,  no organomegaly  Disposition: Final discharge disposition not confirmed  Discharge Orders    Future Appointments: Provider: Department: Dept Phone: Center:   09/16/2011 9:45 AM [facility] [place] [facility] [phone] [facility]     Medication List  As of 09/12/2011  7:57 AM   ASK your doctor about these medications         ACTOPLUS MET XR 30-1000 MG Tb24   Generic drug: Pioglitazone HCl-Metformin HCl   Take 1 tablet by mouth daily with supper.      amlodipine-atorvastatin 10-20 MG per tablet   Commonly known as: CADUET   Take 1  tablet by mouth daily.      FISH OIL PO   Take 1 capsule by mouth every morning.      multivitamin with minerals Tabs   Take 1 tablet by mouth every morning.      sitaGLIPtin 100 MG tablet   Commonly known as: JANUVIA   Take 100 mg by mouth daily.      valsartan-hydrochlorothiazide 320-12.5 MG per tablet   Commonly known as: DIOVAN-HCT   Take 1 tablet by mouth daily.      ZANTAC PO   Take 1 tablet by mouth daily.             SignedGinette Otto 09/12/2011, 7:57 AM

## 2011-09-12 NOTE — Progress Notes (Signed)
Christian Estrada to be D/C'd Home per MD order.  Discussed with the patient and all questions fully answered.   Christian Estrada, Christian Estrada  Home Medication Instructions ZOX:[id number]   Printed on:09/12/11 8119  Medication Information                    Multiple Vitamin (MULTIVITAMIN WITH MINERALS) TABS Take 1 tablet by mouth every morning.           Ranitidine HCl (ZANTAC PO) Take 1 tablet by mouth daily.           Omega-3 Fatty Acids (FISH OIL PO) Take 1 capsule by mouth every morning.           amlodipine-atorvastatin (CADUET) 10-20 MG per tablet Take 1 tablet by mouth daily.           sitaGLIPtin (JANUVIA) 100 MG tablet Take 100 mg by mouth daily.           valsartan-hydrochlorothiazide (DIOVAN-HCT) 320-12.5 MG per tablet Take 1 tablet by mouth daily.           Pioglitazone HCl-Metformin HCl (ACTOPLUS MET XR) 30-1000 MG TB24 Take 1 tablet by mouth daily with supper.             VVS, Skin clean, dry and intact without evidence of skin break down, no evidence of skin tears noted. IV catheter discontinued intact. Site without signs and symptoms of complications. Dressing and pressure applied.  An After Visit Summary was printed and given to the patient. Follow up appointments and handouts on anemia given to pt with teach back. Pt understands s/s of anemia and process. Patient escorted via WC, and D/C home via private auto.  [name], RN 09/12/2011 9:53 AM

## 2011-09-14 ENCOUNTER — Encounter (HOSPITAL_COMMUNITY): Payer: Self-pay | Admitting: Gastroenterology

## 2011-09-14 ENCOUNTER — Encounter (HOSPITAL_COMMUNITY): Payer: Self-pay

## 2011-09-16 ENCOUNTER — Ambulatory Visit
Admission: RE | Admit: 2011-09-16 | Discharge: 2011-09-16 | Disposition: A | Discharge status: Home / Self Care | Payer: Medicare Other | Source: Ambulatory Visit | Attending: Internal Medicine | Admitting: Internal Medicine

## 2011-09-16 ENCOUNTER — Other Ambulatory Visit: Payer: Self-pay | Admitting: Internal Medicine

## 2011-09-16 DIAGNOSIS — R112 Nausea with vomiting, unspecified: Secondary | ICD-10-CM | POA: Diagnosis not present

## 2011-09-16 DIAGNOSIS — R160 Hepatomegaly, not elsewhere classified: Secondary | ICD-10-CM | POA: Diagnosis not present

## 2011-09-23 DIAGNOSIS — R195 Other fecal abnormalities: Secondary | ICD-10-CM | POA: Diagnosis not present

## 2011-09-23 DIAGNOSIS — K922 Gastrointestinal hemorrhage, unspecified: Secondary | ICD-10-CM | POA: Diagnosis not present

## 2011-09-23 DIAGNOSIS — D500 Iron deficiency anemia secondary to blood loss (chronic): Secondary | ICD-10-CM | POA: Diagnosis not present

## 2011-09-24 ENCOUNTER — Other Ambulatory Visit (HOSPITAL_COMMUNITY): Payer: Self-pay | Admitting: *Deleted

## 2011-09-24 DIAGNOSIS — D500 Iron deficiency anemia secondary to blood loss (chronic): Secondary | ICD-10-CM | POA: Diagnosis not present

## 2011-09-25 ENCOUNTER — Ambulatory Visit (HOSPITAL_COMMUNITY)
Admission: RE | Admit: 2011-09-25 | Discharge: 2011-09-25 | Disposition: A | Discharge status: Home / Self Care | Payer: 59 | Source: Ambulatory Visit | Attending: Internal Medicine | Admitting: Internal Medicine

## 2011-09-25 DIAGNOSIS — D509 Iron deficiency anemia, unspecified: Secondary | ICD-10-CM | POA: Insufficient documentation

## 2011-09-25 LAB — PREPARE RBC (CROSSMATCH)

## 2011-09-26 LAB — TYPE AND SCREEN
ABO/RH(D): O POS
Antibody Screen: NEGATIVE
Unit division: 0
Unit division: 0

## 2011-10-02 ENCOUNTER — Other Ambulatory Visit: Payer: Self-pay | Admitting: Gastroenterology

## 2011-10-02 DIAGNOSIS — D509 Iron deficiency anemia, unspecified: Secondary | ICD-10-CM

## 2011-10-07 DIAGNOSIS — D649 Anemia, unspecified: Secondary | ICD-10-CM | POA: Diagnosis not present

## 2011-10-08 ENCOUNTER — Ambulatory Visit
Admission: RE | Admit: 2011-10-08 | Discharge: 2011-10-08 | Disposition: A | Discharge status: Home / Self Care | Payer: Medicare Other | Source: Ambulatory Visit | Attending: Gastroenterology | Admitting: Gastroenterology

## 2011-10-08 DIAGNOSIS — D509 Iron deficiency anemia, unspecified: Secondary | ICD-10-CM

## 2011-10-08 MED ORDER — IOHEXOL 300 MG/ML  SOLN
100.0000 mL | Freq: Once | INTRAMUSCULAR | Status: AC | PRN
  Administered 2011-10-08: 100 mL via INTRAVENOUS

## 2012-01-06 DIAGNOSIS — E1129 Type 2 diabetes mellitus with other diabetic kidney complication: Secondary | ICD-10-CM | POA: Diagnosis not present

## 2012-01-06 DIAGNOSIS — N183 Chronic kidney disease, stage 3 unspecified: Secondary | ICD-10-CM | POA: Diagnosis not present

## 2012-01-06 DIAGNOSIS — K922 Gastrointestinal hemorrhage, unspecified: Secondary | ICD-10-CM | POA: Diagnosis not present

## 2012-01-06 DIAGNOSIS — I10 Essential (primary) hypertension: Secondary | ICD-10-CM | POA: Diagnosis not present

## 2012-01-06 DIAGNOSIS — E782 Mixed hyperlipidemia: Secondary | ICD-10-CM | POA: Diagnosis not present

## 2012-01-07 DIAGNOSIS — C61 Malignant neoplasm of prostate: Secondary | ICD-10-CM

## 2012-01-07 HISTORY — DX: Malignant neoplasm of prostate: C61

## 2012-01-19 DIAGNOSIS — C61 Malignant neoplasm of prostate: Secondary | ICD-10-CM | POA: Diagnosis not present

## 2012-02-22 ENCOUNTER — Encounter: Payer: Self-pay | Admitting: Radiation Oncology

## 2012-02-22 NOTE — Progress Notes (Signed)
New Consult Prostate Adenocarcinoma Biopsy 01/07/12-Gleason= 4+3=7, ,3+3=6, and 3+4=7,volume=18.95cc PSA=6.08 PSA 01/2011=3.86 PSA 11/2010=4.48   Married,   Allergies:PCNS  No history Radiation No history Pacemaker

## 2012-02-23 DIAGNOSIS — R0602 Shortness of breath: Secondary | ICD-10-CM | POA: Insufficient documentation

## 2012-02-23 DIAGNOSIS — E7800 Pure hypercholesterolemia, unspecified: Secondary | ICD-10-CM | POA: Insufficient documentation

## 2012-02-23 DIAGNOSIS — K219 Gastro-esophageal reflux disease without esophagitis: Secondary | ICD-10-CM | POA: Insufficient documentation

## 2012-02-23 DIAGNOSIS — N138 Other obstructive and reflux uropathy: Secondary | ICD-10-CM | POA: Insufficient documentation

## 2012-02-23 DIAGNOSIS — IMO0001 Reserved for inherently not codable concepts without codable children: Secondary | ICD-10-CM | POA: Insufficient documentation

## 2012-02-23 DIAGNOSIS — M549 Dorsalgia, unspecified: Secondary | ICD-10-CM | POA: Insufficient documentation

## 2012-02-23 DIAGNOSIS — I499 Cardiac arrhythmia, unspecified: Secondary | ICD-10-CM | POA: Insufficient documentation

## 2012-02-23 DIAGNOSIS — I10 Essential (primary) hypertension: Secondary | ICD-10-CM | POA: Insufficient documentation

## 2012-02-23 DIAGNOSIS — N401 Enlarged prostate with lower urinary tract symptoms: Secondary | ICD-10-CM

## 2012-02-24 ENCOUNTER — Ambulatory Visit
Admission: RE | Admit: 2012-02-24 | Discharge: 2012-02-24 | Disposition: A | Discharge status: Home / Self Care | Payer: 59 | Source: Ambulatory Visit | Attending: Radiation Oncology | Admitting: Radiation Oncology

## 2012-02-24 ENCOUNTER — Encounter: Payer: Self-pay | Admitting: Radiation Oncology

## 2012-02-24 VITALS — BP 140/66 | HR 80 | Temp 97.9°F | Resp 16 | Ht 72.0 in | Wt 265.9 lb

## 2012-02-24 DIAGNOSIS — C61 Malignant neoplasm of prostate: Secondary | ICD-10-CM | POA: Insufficient documentation

## 2012-02-24 HISTORY — DX: Malignant neoplasm of prostate: C61

## 2012-02-24 HISTORY — DX: Male erectile dysfunction, unspecified: N52.9

## 2012-02-24 HISTORY — DX: Benign prostatic hyperplasia with lower urinary tract symptoms: N40.1

## 2012-02-24 HISTORY — DX: Allergy, unspecified, initial encounter: T78.40XA

## 2012-02-24 HISTORY — DX: Other obstructive and reflux uropathy: N40.1

## 2012-02-24 HISTORY — DX: Other obstructive and reflux uropathy: N13.8

## 2012-02-24 HISTORY — DX: Pure hypercholesterolemia, unspecified: E78.00

## 2012-02-24 NOTE — Progress Notes (Signed)
See progress note under physician encounter. 

## 2012-02-24 NOTE — Progress Notes (Addendum)
Patient presents to the clinic today to discuss the role of radiation therapy in the treatment of prostate ca. Patient alert and oriented to person, place, and time. No distress noted. Steady gait noted. Pleasant affect noted. Patient denies pain at this time. Patient denies unintentional weight loss. Patient reports a good appetite and that he sleeps without difficulty. Patient denies nausea, vomiting, headache or dizziness. Patient reports that he was hospitalized in August due to rectal bleeding associated with aspirin use. 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 Patient denies burning with urination. Patient denies hematuria. Patient denies urinary frequency or inability to completely empty his bladder. Patient reports a normal strong urine stream. Patient reports that he take daily iron supplements but, continues to have daily formed bowel movements. Reported all findings to Dr. Dayton Scrape.      Retired from [employer]. Works for the [employer] at night. Complete PATIENT MEASURE OF DISTRESS worksheet with a score of 1submitted to social work.    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

## 2012-02-24 NOTE — Progress Notes (Signed)
[facility] Radiation Oncology NEW PATIENT EVALUATION  Name: Christian Estrada MRN: [id number]  Date:   02/24/2012           DOB: 03-Sep-1945  Status: outpatient   CC:   Lindaann Slough, MD , Dr. Trula Slade   REFERRING PHYSICIAN: Lindaann Slough, MD   DIAGNOSIS:  Stage TI C. intermediate risk adenocarcinoma prostate  HISTORY OF PRESENT ILLNESS:  Christian Estrada is a 67 y.o. male who is seen today for the courtesy of Dr. Brunilda Payor for discussion of possible radiation therapy in the management of his stage TI C. intermediate risk adenocarcinoma prostate. He was seen by Dr. Brunilda Payor back in November 2012  for a recent rise in his PSA from 3.35 in May 2011 to 4.48 by October 2012. He was placed on antibiotics and his PSA fell to 3.86 by 01/28/2011. A repeat PSA on 11/16/2011 was 6.08 and he was seen again by Dr. Brunilda Payor for further evaluation leading to ultrasound-guided biopsies on 01/07/2012. His then have Gleason 7 (4+3) involving 20% of one core from right lateral base Gleason 7 (3+4) involving 70% of one core from the left lateral apex. He also had Gleason 6 (3+3) involving 10% of one core from the right lateral mid gland, less than 5% of one core from the left lateral base, 5% of one core from the left lateral mid gland, 5% of one core from left mid gland and 10% of one core from the left apex. He also had areas of high-grade PIN. His gland volume was only 18.95 ccs. he is doing well from a GU and GI standpoint. His I PSS score is 4. He does have some degree of erectile dysfunction.  PREVIOUS RADIATION THERAPY: No   PAST MEDICAL HISTORY:  has a past medical history of Reflux; Hypertension; Diabetes mellitus; Back pain; Shortness of breath; GERD (gastroesophageal reflux disease); Dysrhythmia; Prostate cancer (01/07/12); Allergy; Hypercholesterolemia; BPH with obstruction/lower urinary tract symptoms; and ED (erectile dysfunction).     PAST SURGICAL HISTORY:  Past Surgical History    Procedure Date  . Esophagogastroduodenoscopy 09/11/2011    Procedure: ESOPHAGOGASTRODUODENOSCOPY (EGD);  Surgeon: Barrie Folk, MD;  Location[facility] ENDOSCOPY;  Service: Endoscopy;  Laterality: N/A;  . Circumcision      FAMILY HISTORY: family history includes Cancer in his brothers and Diabetes in his mother. His father died from metastatic prostate cancer in his 57s. His mother died from complications of diabetes and cardiac disease.   SOCIAL HISTORY:  reports that he has never smoked. He does not have any smokeless tobacco history on file. He reports that he does not drink alcohol or use illicit drugs. Married, one child. Retired from the [employer] business"   ALLERGIES: Penicillins   MEDICATIONS:  Current Outpatient Prescriptions  Medication Sig Dispense Refill  . amlodipine-atorvastatin (CADUET) 10-20 MG per tablet Take 1 tablet by mouth daily.      . ferrous fumarate (HEMOCYTE - 106 MG FE) 325 (106 FE) MG TABS Take 1 tablet by mouth.      . Multiple Vitamin (MULTIVITAMIN WITH MINERALS) TABS Take 1 tablet by mouth every morning.      . Omega-3 Fatty Acids (FISH OIL PO) Take 1 capsule by mouth every morning.      . Pioglitazone HCl-Metformin HCl (ACTOPLUS MET XR) 30-1000 MG TB24 Take 1 tablet by mouth daily with supper.      . Ranitidine HCl (ZANTAC PO) Take 1 tablet by mouth daily.      [name]  sitaGLIPtin (JANUVIA) 100 MG tablet Take 100 mg by mouth daily.      . valsartan-hydrochlorothiazide (DIOVAN-HCT) 320-12.5 MG per tablet Take 1 tablet by mouth daily.      . vitamin C (ASCORBIC ACID) 500 MG tablet Take 500 mg by mouth daily.      [name] aspirin 81 MG tablet Take 81 mg by mouth daily.         REVIEW OF SYSTEMS:  Pertinent items are noted in HPI.    PHYSICAL EXAM:  height is 6' (1.829 m) and weight is 265 lb 14.4 oz (120.611 kg). His oral temperature is 97.9 F (36.6 C). His blood pressure is 140/66 and his pulse is 80. His respiration is 16 and oxygen saturation is 100%.   Alert and  oriented. Head and neck examination: Grossly unremarkable. Nodes: Without palpable cervical or supraclavicular lymphadenopathy. Chest: Lungs clear. Heart: Regular rate and rhythm. Back: Without spinal or CVA tenderness. Abdomen: Without masses organomegaly. Genitalia: Unremarkable to inspection. Rectal: The prostate gland is small and is without focal induration or nodularity. Extremities: Without edema. Neurologic examination: Grossly nonfocal.   LABORATORY DATA:  Lab Results  Component Value Date   WBC 10.2 09/11/2011   HGB 8.6* 09/11/2011   HCT 26.5* 09/11/2011   MCV 82.0 09/11/2011   PLT 342 09/11/2011   Lab Results  Component Value Date   NA 141 09/11/2011   K 4.2 09/11/2011   CL 108 09/11/2011   CO2 23 09/11/2011   Lab Results  Component Value Date   ALT 9 09/11/2011   AST 12 09/11/2011   ALKPHOS 59 09/11/2011   BILITOT 0.5 09/11/2011      IMPRESSION: Stage TI C. intermediate risk adenocarcinoma prostate. I explained to the patient and his wife that his prognosis is related to his stage, Gleason score, and PSA level. His stage and PSA level are favorable while  his Gleason score 7 is of intermediate favorability. We discussed surgery versus close surveillance versus radiation therapy. He is not a good candidate for close surveillance based on his Gleason score of 7 and age of 82. Radiation therapy options include 5 weeks of external beam followed by seed implantation boost or 8 weeks of external beam/IMRT. Seed implantation alone is not an option.  I have one mild reservation regarding seed implantation as a boost because of his small prostate volume making the implant more demanding from a technical standpoint with requirement of precision placement of seeds along the periphery of the gland. My absolute cutoff for a seed implant  is in a of gland less than 15 cc in size, and his gland volume approaches this. We discussed the potential acute and late toxicities of radiation therapy. We also discussed  comfortable bladder filling to minimize radiation to his bladder. We discussed the need for placement of 3 gold seed markers for prostate image guidance whether he has 5 weeks of external beam followed by a seed implant boost or 8 weeks of external beam/IMRT. He tells me he is interested in radiation therapy and will give some thought as to which approach would be preferable. I gave him my phone number and will contact me with his decision. He was offered consultation with one of the robotic surgeons, , but he is clear that he is not interested in surgery at this time.   PLAN: As discussed above. He'll contact me regarding his choice of therapy. In any case, I will need to kindly asked Dr. Brunilda Payor to place 3  gold markers within the prostate.   I spent 60 minutes minutes face to face with the patient and more than 50% of that time was spent in counseling and/or coordination of care.

## 2012-03-07 ENCOUNTER — Encounter: Payer: Self-pay | Admitting: Radiation Oncology

## 2012-03-07 ENCOUNTER — Telehealth: Payer: Self-pay | Admitting: *Deleted

## 2012-03-07 NOTE — Progress Notes (Signed)
Chart note: Christian Estrada called me today to tell me that he wants to proceed with 8 weeks of external beam/IMRT. Which I think would be a good choice for him. We will kindly asked Dr. Brunilda Payor to placed 3 gold seed markers within his prostate and then have him return for simulation/treatment planning.

## 2012-03-07 NOTE — Telephone Encounter (Signed)
Called patient to inform of gold seed placement on 03-17-12- arrival time - 1:30 pm at Dr. Brunilda Payor' Office and his sim on 03-21-12 at 11:00 am at Dr. Rennie Plowman Office, spoke with patient and he is aware of these appts.

## 2012-03-07 NOTE — Telephone Encounter (Signed)
Patient's sim will be on 04-07-12 at 10:00 am with Dr. Dayton Scrape

## 2012-03-07 NOTE — Telephone Encounter (Signed)
Scheduled gold seed placement for this patient with Dr. Brunilda Payor on March 3 at 4:00 pm, arrival time- 3:45 pm, I have requested an earlier date and I am waiting for a return call

## 2012-03-21 ENCOUNTER — Ambulatory Visit: Payer: 59 | Admitting: Radiation Oncology

## 2012-03-22 ENCOUNTER — Telehealth: Payer: Self-pay | Admitting: *Deleted

## 2012-03-22 NOTE — Telephone Encounter (Signed)
CALLED PATIENT TO INFORM OF APPT. FOR SIM ON 03-28-12 AT 10:00 AM, SPOKE WITH PATIENT AND HE IS AWARE OF THIS APPT.

## 2012-03-25 ENCOUNTER — Telehealth: Payer: Self-pay | Admitting: *Deleted

## 2012-03-25 NOTE — Telephone Encounter (Signed)
CALLED PATIENT TO REMIND OF APPT. FOR 03-28-12 AT 10:00 AM, SPOKE WITH PATIENT AND HE IS AWARE OF THIS APPT.

## 2012-03-28 ENCOUNTER — Telehealth: Payer: Self-pay | Admitting: Radiation Oncology

## 2012-03-28 ENCOUNTER — Ambulatory Visit
Admission: RE | Admit: 2012-03-28 | Discharge: 2012-03-28 | Disposition: A | Discharge status: Home / Self Care | Payer: 59 | Source: Ambulatory Visit | Attending: Radiation Oncology | Admitting: Radiation Oncology

## 2012-03-28 DIAGNOSIS — C61 Malignant neoplasm of prostate: Secondary | ICD-10-CM | POA: Diagnosis not present

## 2012-03-28 DIAGNOSIS — R350 Frequency of micturition: Secondary | ICD-10-CM | POA: Diagnosis not present

## 2012-03-28 DIAGNOSIS — R351 Nocturia: Secondary | ICD-10-CM | POA: Insufficient documentation

## 2012-03-28 DIAGNOSIS — R3915 Urgency of urination: Secondary | ICD-10-CM | POA: Diagnosis not present

## 2012-03-28 DIAGNOSIS — Z510 Encounter for antineoplastic radiation therapy: Secondary | ICD-10-CM | POA: Diagnosis not present

## 2012-03-28 NOTE — Telephone Encounter (Signed)
Met w patient and his wife Christian Estrada) today to discuss RO billing. Pt had some financial concerns and was given an EPP and MCD application to complete and return.  Dx:: Prostate cancer - Primary 185  Attending Rad:  RM  Rad Tx:  IMRT

## 2012-03-28 NOTE — Progress Notes (Signed)
Simulation/treatment planning note: The patient was taken to the CT simulator after sign consent. A Vac lock immobilization device was constructed. A red rubber catheter was placed within the rectal vault. He was then catheterized and contrast instilled the bladder/urethra. His pelvis was scanned. I chose and isocenter in the center of the prostate. I contoured his prostate, seminal vesicles, bladder, and rectum. I prescribing 7800 cGy in 40 sessions to his prostate PTV which represents the prostate was 0.8 cm except for 0.5 cm along the rectum. I am prescribing 5600 cGy in 40 sessions to his seminal vesicle PTV which represents his seminal vesicles +0.5cm. He'll undergo daily image guidance and be treated with a comfortably full bladder.

## 2012-03-29 DIAGNOSIS — C61 Malignant neoplasm of prostate: Secondary | ICD-10-CM | POA: Diagnosis not present

## 2012-03-29 DIAGNOSIS — Z510 Encounter for antineoplastic radiation therapy: Secondary | ICD-10-CM | POA: Diagnosis not present

## 2012-03-29 DIAGNOSIS — R3915 Urgency of urination: Secondary | ICD-10-CM | POA: Diagnosis not present

## 2012-03-29 DIAGNOSIS — R351 Nocturia: Secondary | ICD-10-CM | POA: Diagnosis not present

## 2012-03-29 DIAGNOSIS — R350 Frequency of micturition: Secondary | ICD-10-CM | POA: Diagnosis not present

## 2012-04-07 ENCOUNTER — Ambulatory Visit: Payer: 59

## 2012-04-07 ENCOUNTER — Other Ambulatory Visit: Payer: Self-pay | Admitting: Radiation Oncology

## 2012-04-07 ENCOUNTER — Ambulatory Visit
Admission: RE | Admit: 2012-04-07 | Discharge: 2012-04-07 | Disposition: A | Discharge status: Home / Self Care | Payer: 59 | Source: Ambulatory Visit | Attending: Radiation Oncology | Admitting: Radiation Oncology

## 2012-04-07 DIAGNOSIS — Z510 Encounter for antineoplastic radiation therapy: Secondary | ICD-10-CM | POA: Diagnosis not present

## 2012-04-07 DIAGNOSIS — R3915 Urgency of urination: Secondary | ICD-10-CM | POA: Diagnosis not present

## 2012-04-07 DIAGNOSIS — C61 Malignant neoplasm of prostate: Secondary | ICD-10-CM

## 2012-04-07 DIAGNOSIS — R351 Nocturia: Secondary | ICD-10-CM | POA: Diagnosis not present

## 2012-04-07 DIAGNOSIS — R350 Frequency of micturition: Secondary | ICD-10-CM | POA: Diagnosis not present

## 2012-04-07 NOTE — Progress Notes (Signed)
Simulation verification note: The patient underwent simulation verification today with [facility] sitting up to his gold seeds also cone beam CT. The alignment was excellent.

## 2012-04-08 ENCOUNTER — Ambulatory Visit: Payer: 59

## 2012-04-11 ENCOUNTER — Encounter: Payer: Self-pay | Admitting: Radiation Oncology

## 2012-04-11 ENCOUNTER — Ambulatory Visit
Admission: RE | Admit: 2012-04-11 | Discharge: 2012-04-11 | Disposition: A | Discharge status: Home / Self Care | Payer: 59 | Source: Ambulatory Visit | Attending: Radiation Oncology | Admitting: Radiation Oncology

## 2012-04-11 ENCOUNTER — Ambulatory Visit: Payer: 59

## 2012-04-11 VITALS — BP 129/82 | HR 91 | Resp 18 | Wt 260.5 lb

## 2012-04-11 DIAGNOSIS — C61 Malignant neoplasm of prostate: Secondary | ICD-10-CM | POA: Diagnosis not present

## 2012-04-11 DIAGNOSIS — R350 Frequency of micturition: Secondary | ICD-10-CM | POA: Diagnosis not present

## 2012-04-11 DIAGNOSIS — R3915 Urgency of urination: Secondary | ICD-10-CM | POA: Diagnosis not present

## 2012-04-11 DIAGNOSIS — Z510 Encounter for antineoplastic radiation therapy: Secondary | ICD-10-CM | POA: Diagnosis not present

## 2012-04-11 DIAGNOSIS — R351 Nocturia: Secondary | ICD-10-CM | POA: Diagnosis not present

## 2012-04-11 NOTE — Progress Notes (Signed)
Patient presents to the clinic today accompanied by his wife for PUT with Dr. Dayton Scrape. Patient has questions as to whether or not he can begin taking his aspirin again. Patient is alert and oriented to person, place, and time. No distress noted. Steady gait noted. Pleasant affect noted. Patient denies pain at this time. Patient denies hematuria. Patient denies burning with urination. Patient denies diarrhea or constipation. Patient reports on average he gets up twice during the night to void. Patient reports a normal strong urine stream. Reported all findings to Dr. Dayton Scrape.

## 2012-04-11 NOTE — Progress Notes (Signed)
Weekly Management Note:  Site:Prostate Current Dose:  390  cGy Projected Dose: 7800  cGy  Narrative: The patient is seen today for routine under treatment assessment. CBCT/MVCT images/port films were reviewed. The chart was reviewed. Bladder filling is satisfactory.  No GU or GI difficulties.  Physical Examination:  Filed Vitals:   04/11/12 0919  BP: 129/82  Pulse: 91  Resp: 18  .  Weight: 260 lb 8 oz (118.162 kg). No change.  Impression: Tolerating radiation therapy well.  Plan: Continue radiation therapy as planned.

## 2012-04-12 ENCOUNTER — Ambulatory Visit: Payer: 59

## 2012-04-12 ENCOUNTER — Ambulatory Visit
Admission: RE | Admit: 2012-04-12 | Discharge: 2012-04-12 | Disposition: A | Discharge status: Home / Self Care | Payer: 59 | Source: Ambulatory Visit | Attending: Radiation Oncology | Admitting: Radiation Oncology

## 2012-04-12 DIAGNOSIS — Z510 Encounter for antineoplastic radiation therapy: Secondary | ICD-10-CM | POA: Diagnosis not present

## 2012-04-12 DIAGNOSIS — C61 Malignant neoplasm of prostate: Secondary | ICD-10-CM | POA: Diagnosis not present

## 2012-04-12 DIAGNOSIS — R350 Frequency of micturition: Secondary | ICD-10-CM | POA: Diagnosis not present

## 2012-04-12 DIAGNOSIS — R351 Nocturia: Secondary | ICD-10-CM | POA: Diagnosis not present

## 2012-04-12 DIAGNOSIS — R3915 Urgency of urination: Secondary | ICD-10-CM | POA: Diagnosis not present

## 2012-04-13 ENCOUNTER — Ambulatory Visit: Payer: 59

## 2012-04-13 ENCOUNTER — Ambulatory Visit
Admission: RE | Admit: 2012-04-13 | Discharge: 2012-04-13 | Disposition: A | Discharge status: Home / Self Care | Payer: 59 | Source: Ambulatory Visit | Attending: Radiation Oncology | Admitting: Radiation Oncology

## 2012-04-13 DIAGNOSIS — Z510 Encounter for antineoplastic radiation therapy: Secondary | ICD-10-CM | POA: Diagnosis not present

## 2012-04-13 DIAGNOSIS — C61 Malignant neoplasm of prostate: Secondary | ICD-10-CM | POA: Diagnosis not present

## 2012-04-13 DIAGNOSIS — R3915 Urgency of urination: Secondary | ICD-10-CM | POA: Diagnosis not present

## 2012-04-13 DIAGNOSIS — R350 Frequency of micturition: Secondary | ICD-10-CM | POA: Diagnosis not present

## 2012-04-13 DIAGNOSIS — R351 Nocturia: Secondary | ICD-10-CM | POA: Diagnosis not present

## 2012-04-14 ENCOUNTER — Ambulatory Visit: Payer: 59

## 2012-04-14 ENCOUNTER — Ambulatory Visit
Admission: RE | Admit: 2012-04-14 | Discharge: 2012-04-14 | Disposition: A | Discharge status: Home / Self Care | Payer: 59 | Source: Ambulatory Visit | Attending: Radiation Oncology | Admitting: Radiation Oncology

## 2012-04-14 DIAGNOSIS — C61 Malignant neoplasm of prostate: Secondary | ICD-10-CM | POA: Diagnosis not present

## 2012-04-14 DIAGNOSIS — R351 Nocturia: Secondary | ICD-10-CM | POA: Diagnosis not present

## 2012-04-14 DIAGNOSIS — R3915 Urgency of urination: Secondary | ICD-10-CM | POA: Diagnosis not present

## 2012-04-14 DIAGNOSIS — R350 Frequency of micturition: Secondary | ICD-10-CM | POA: Diagnosis not present

## 2012-04-14 DIAGNOSIS — Z510 Encounter for antineoplastic radiation therapy: Secondary | ICD-10-CM | POA: Diagnosis not present

## 2012-04-15 ENCOUNTER — Ambulatory Visit: Payer: 59

## 2012-04-15 ENCOUNTER — Ambulatory Visit
Admission: RE | Admit: 2012-04-15 | Discharge: 2012-04-15 | Disposition: A | Discharge status: Home / Self Care | Payer: 59 | Source: Ambulatory Visit | Attending: Radiation Oncology | Admitting: Radiation Oncology

## 2012-04-15 DIAGNOSIS — R350 Frequency of micturition: Secondary | ICD-10-CM | POA: Diagnosis not present

## 2012-04-15 DIAGNOSIS — Z510 Encounter for antineoplastic radiation therapy: Secondary | ICD-10-CM | POA: Diagnosis not present

## 2012-04-15 DIAGNOSIS — R351 Nocturia: Secondary | ICD-10-CM | POA: Diagnosis not present

## 2012-04-15 DIAGNOSIS — C61 Malignant neoplasm of prostate: Secondary | ICD-10-CM | POA: Diagnosis not present

## 2012-04-15 DIAGNOSIS — R3915 Urgency of urination: Secondary | ICD-10-CM | POA: Diagnosis not present

## 2012-04-18 ENCOUNTER — Encounter: Payer: Self-pay | Admitting: Radiation Oncology

## 2012-04-18 ENCOUNTER — Ambulatory Visit
Admission: RE | Admit: 2012-04-18 | Discharge: 2012-04-18 | Disposition: A | Discharge status: Home / Self Care | Payer: 59 | Source: Ambulatory Visit | Attending: Radiation Oncology | Admitting: Radiation Oncology

## 2012-04-18 ENCOUNTER — Ambulatory Visit: Payer: 59

## 2012-04-18 VITALS — BP 137/53 | HR 80 | Temp 97.7°F | Wt 265.9 lb

## 2012-04-18 DIAGNOSIS — C61 Malignant neoplasm of prostate: Secondary | ICD-10-CM

## 2012-04-18 DIAGNOSIS — R351 Nocturia: Secondary | ICD-10-CM | POA: Diagnosis not present

## 2012-04-18 DIAGNOSIS — R3915 Urgency of urination: Secondary | ICD-10-CM | POA: Diagnosis not present

## 2012-04-18 DIAGNOSIS — Z510 Encounter for antineoplastic radiation therapy: Secondary | ICD-10-CM | POA: Diagnosis not present

## 2012-04-18 DIAGNOSIS — R350 Frequency of micturition: Secondary | ICD-10-CM | POA: Diagnosis not present

## 2012-04-18 NOTE — Progress Notes (Signed)
Mr. Seymore is here for his weekly ut check.  He has had 7/40 fractions to his prostate.  He denies pain, fatigue, urinary hesitancy, hematuria and diarrhea.  He works nights and states that he needs to go 2 times per night.

## 2012-04-18 NOTE — Progress Notes (Signed)
   Weekly Management Note:  outpatient Current Dose:  13.65 Gy  Projected Dose: 78 Gy   Narrative:  The patient presents for routine under treatment assessment.  CBCT/MVCT images/Port film x-rays were reviewed.  The chart was checked. Doing well. No new complaints  Physical Findings:  weight is 265 lb 14.4 oz (120.611 kg). His temperature is 97.7 F (36.5 C). His blood pressure is 137/53 and his pulse is 80.  Well appearing  Impression:  The patient is tolerating radiotherapy.  Plan:  Continue radiotherapy as planned.  ________________________________   Lonie Peak, M.D.

## 2012-04-19 ENCOUNTER — Ambulatory Visit: Payer: 59

## 2012-04-19 ENCOUNTER — Ambulatory Visit
Admission: RE | Admit: 2012-04-19 | Discharge: 2012-04-19 | Disposition: A | Discharge status: Home / Self Care | Payer: 59 | Source: Ambulatory Visit | Attending: Radiation Oncology | Admitting: Radiation Oncology

## 2012-04-19 DIAGNOSIS — R3915 Urgency of urination: Secondary | ICD-10-CM | POA: Diagnosis not present

## 2012-04-19 DIAGNOSIS — Z510 Encounter for antineoplastic radiation therapy: Secondary | ICD-10-CM | POA: Diagnosis not present

## 2012-04-19 DIAGNOSIS — C61 Malignant neoplasm of prostate: Secondary | ICD-10-CM | POA: Diagnosis not present

## 2012-04-19 DIAGNOSIS — R350 Frequency of micturition: Secondary | ICD-10-CM | POA: Diagnosis not present

## 2012-04-19 DIAGNOSIS — R351 Nocturia: Secondary | ICD-10-CM | POA: Diagnosis not present

## 2012-04-20 ENCOUNTER — Ambulatory Visit
Admission: RE | Admit: 2012-04-20 | Discharge: 2012-04-20 | Disposition: A | Discharge status: Home / Self Care | Payer: 59 | Source: Ambulatory Visit | Attending: Radiation Oncology | Admitting: Radiation Oncology

## 2012-04-20 ENCOUNTER — Ambulatory Visit: Payer: 59

## 2012-04-20 DIAGNOSIS — R351 Nocturia: Secondary | ICD-10-CM | POA: Diagnosis not present

## 2012-04-20 DIAGNOSIS — R3915 Urgency of urination: Secondary | ICD-10-CM | POA: Diagnosis not present

## 2012-04-20 DIAGNOSIS — C61 Malignant neoplasm of prostate: Secondary | ICD-10-CM | POA: Diagnosis not present

## 2012-04-20 DIAGNOSIS — R350 Frequency of micturition: Secondary | ICD-10-CM | POA: Diagnosis not present

## 2012-04-20 DIAGNOSIS — Z510 Encounter for antineoplastic radiation therapy: Secondary | ICD-10-CM | POA: Diagnosis not present

## 2012-04-21 ENCOUNTER — Ambulatory Visit: Payer: 59

## 2012-04-21 ENCOUNTER — Ambulatory Visit
Admission: RE | Admit: 2012-04-21 | Discharge: 2012-04-21 | Disposition: A | Discharge status: Home / Self Care | Payer: 59 | Source: Ambulatory Visit | Attending: Radiation Oncology | Admitting: Radiation Oncology

## 2012-04-21 DIAGNOSIS — R3915 Urgency of urination: Secondary | ICD-10-CM | POA: Diagnosis not present

## 2012-04-21 DIAGNOSIS — R351 Nocturia: Secondary | ICD-10-CM | POA: Diagnosis not present

## 2012-04-21 DIAGNOSIS — C61 Malignant neoplasm of prostate: Secondary | ICD-10-CM | POA: Diagnosis not present

## 2012-04-21 DIAGNOSIS — R350 Frequency of micturition: Secondary | ICD-10-CM | POA: Diagnosis not present

## 2012-04-21 DIAGNOSIS — Z510 Encounter for antineoplastic radiation therapy: Secondary | ICD-10-CM | POA: Diagnosis not present

## 2012-04-22 ENCOUNTER — Ambulatory Visit
Admission: RE | Admit: 2012-04-22 | Discharge: 2012-04-22 | Disposition: A | Discharge status: Home / Self Care | Payer: 59 | Source: Ambulatory Visit | Attending: Radiation Oncology | Admitting: Radiation Oncology

## 2012-04-22 ENCOUNTER — Ambulatory Visit: Payer: 59

## 2012-04-22 DIAGNOSIS — R3915 Urgency of urination: Secondary | ICD-10-CM | POA: Diagnosis not present

## 2012-04-22 DIAGNOSIS — Z510 Encounter for antineoplastic radiation therapy: Secondary | ICD-10-CM | POA: Diagnosis not present

## 2012-04-22 DIAGNOSIS — R351 Nocturia: Secondary | ICD-10-CM | POA: Diagnosis not present

## 2012-04-22 DIAGNOSIS — C61 Malignant neoplasm of prostate: Secondary | ICD-10-CM | POA: Diagnosis not present

## 2012-04-22 DIAGNOSIS — R350 Frequency of micturition: Secondary | ICD-10-CM | POA: Diagnosis not present

## 2012-04-25 ENCOUNTER — Ambulatory Visit
Admission: RE | Admit: 2012-04-25 | Discharge: 2012-04-25 | Disposition: A | Discharge status: Home / Self Care | Payer: 59 | Source: Ambulatory Visit | Attending: Radiation Oncology | Admitting: Radiation Oncology

## 2012-04-25 ENCOUNTER — Ambulatory Visit: Payer: 59

## 2012-04-25 VITALS — BP 148/57 | HR 97 | Temp 97.5°F | Wt 264.9 lb

## 2012-04-25 DIAGNOSIS — R3915 Urgency of urination: Secondary | ICD-10-CM | POA: Diagnosis not present

## 2012-04-25 DIAGNOSIS — C61 Malignant neoplasm of prostate: Secondary | ICD-10-CM | POA: Diagnosis not present

## 2012-04-25 DIAGNOSIS — R351 Nocturia: Secondary | ICD-10-CM | POA: Diagnosis not present

## 2012-04-25 DIAGNOSIS — Z510 Encounter for antineoplastic radiation therapy: Secondary | ICD-10-CM | POA: Diagnosis not present

## 2012-04-25 DIAGNOSIS — R350 Frequency of micturition: Secondary | ICD-10-CM | POA: Diagnosis not present

## 2012-04-25 NOTE — Progress Notes (Signed)
Weekly Management Note:  Site: Prostate Current Dose:  2340  cGy Projected Dose: 7800  cGy  Narrative: The patient is seen today for routine under treatment assessment. CBCT/MVCT images/port films were reviewed. The chart was reviewed.  Bladder filling is satisfactory. He is without significant GU or GI difficulties. He does omit to some urinary frequency. He has nocturia x1.  Physical Examination:  Filed Vitals:   04/25/12 0922  BP: 148/57  Pulse: 97  Temp: 97.5 F (36.4 C)  .  Weight: 264 lb 14.4 oz (120.158 kg). No change.  Impression: Tolerating radiation therapy well.  Plan: Continue radiation therapy as planned.

## 2012-04-25 NOTE — Progress Notes (Addendum)
Mr. Massman here for weekly ut visit.  He has had 12/40 fractions to his prostate.  He denies pain, fatigue, urinary hesitancy, diarrhea and hematuria.  He is noticing some urinary frequency.  He usually has to get up once per night to urinate.  He also has some dryness on his abdomen and is wondering if he should start using Radiaplex gel.

## 2012-04-26 ENCOUNTER — Ambulatory Visit
Admission: RE | Admit: 2012-04-26 | Discharge: 2012-04-26 | Disposition: A | Discharge status: Home / Self Care | Payer: 59 | Source: Ambulatory Visit | Attending: Radiation Oncology | Admitting: Radiation Oncology

## 2012-04-26 ENCOUNTER — Ambulatory Visit: Payer: 59

## 2012-04-26 DIAGNOSIS — R350 Frequency of micturition: Secondary | ICD-10-CM | POA: Diagnosis not present

## 2012-04-26 DIAGNOSIS — R351 Nocturia: Secondary | ICD-10-CM | POA: Diagnosis not present

## 2012-04-26 DIAGNOSIS — C61 Malignant neoplasm of prostate: Secondary | ICD-10-CM

## 2012-04-26 DIAGNOSIS — Z510 Encounter for antineoplastic radiation therapy: Secondary | ICD-10-CM | POA: Diagnosis not present

## 2012-04-26 DIAGNOSIS — R3915 Urgency of urination: Secondary | ICD-10-CM | POA: Diagnosis not present

## 2012-04-26 MED ORDER — RADIAPLEXRX EX GEL
Freq: Once | CUTANEOUS | Status: AC
  Administered 2012-04-26: 1 via TOPICAL

## 2012-04-26 NOTE — Progress Notes (Addendum)
Ok per Dr. Dayton Scrape, Christian Estrada given radiaplex gel for his skin on his abdomen.  He was educated on applying it 4 hours before treatment or right after treatment and at bedtime.

## 2012-04-27 ENCOUNTER — Ambulatory Visit
Admission: RE | Admit: 2012-04-27 | Discharge: 2012-04-27 | Disposition: A | Discharge status: Home / Self Care | Payer: 59 | Source: Ambulatory Visit | Attending: Radiation Oncology | Admitting: Radiation Oncology

## 2012-04-27 ENCOUNTER — Ambulatory Visit: Payer: 59

## 2012-04-27 DIAGNOSIS — C61 Malignant neoplasm of prostate: Secondary | ICD-10-CM | POA: Diagnosis not present

## 2012-04-27 DIAGNOSIS — R350 Frequency of micturition: Secondary | ICD-10-CM | POA: Diagnosis not present

## 2012-04-27 DIAGNOSIS — R3915 Urgency of urination: Secondary | ICD-10-CM | POA: Diagnosis not present

## 2012-04-27 DIAGNOSIS — R351 Nocturia: Secondary | ICD-10-CM | POA: Diagnosis not present

## 2012-04-27 DIAGNOSIS — Z510 Encounter for antineoplastic radiation therapy: Secondary | ICD-10-CM | POA: Diagnosis not present

## 2012-04-28 ENCOUNTER — Ambulatory Visit
Admission: RE | Admit: 2012-04-28 | Discharge: 2012-04-28 | Disposition: A | Discharge status: Home / Self Care | Payer: 59 | Source: Ambulatory Visit | Attending: Radiation Oncology | Admitting: Radiation Oncology

## 2012-04-28 ENCOUNTER — Ambulatory Visit: Payer: 59

## 2012-04-28 DIAGNOSIS — R351 Nocturia: Secondary | ICD-10-CM | POA: Diagnosis not present

## 2012-04-28 DIAGNOSIS — R3915 Urgency of urination: Secondary | ICD-10-CM | POA: Diagnosis not present

## 2012-04-28 DIAGNOSIS — R350 Frequency of micturition: Secondary | ICD-10-CM | POA: Diagnosis not present

## 2012-04-28 DIAGNOSIS — C61 Malignant neoplasm of prostate: Secondary | ICD-10-CM | POA: Diagnosis not present

## 2012-04-28 DIAGNOSIS — Z510 Encounter for antineoplastic radiation therapy: Secondary | ICD-10-CM | POA: Diagnosis not present

## 2012-04-29 ENCOUNTER — Ambulatory Visit: Payer: 59

## 2012-04-29 ENCOUNTER — Ambulatory Visit
Admission: RE | Admit: 2012-04-29 | Discharge: 2012-04-29 | Disposition: A | Discharge status: Home / Self Care | Payer: 59 | Source: Ambulatory Visit | Attending: Radiation Oncology | Admitting: Radiation Oncology

## 2012-04-29 DIAGNOSIS — C61 Malignant neoplasm of prostate: Secondary | ICD-10-CM | POA: Diagnosis not present

## 2012-04-29 DIAGNOSIS — R351 Nocturia: Secondary | ICD-10-CM | POA: Diagnosis not present

## 2012-04-29 DIAGNOSIS — R3915 Urgency of urination: Secondary | ICD-10-CM | POA: Diagnosis not present

## 2012-04-29 DIAGNOSIS — Z510 Encounter for antineoplastic radiation therapy: Secondary | ICD-10-CM | POA: Diagnosis not present

## 2012-04-29 DIAGNOSIS — R350 Frequency of micturition: Secondary | ICD-10-CM | POA: Diagnosis not present

## 2012-05-02 ENCOUNTER — Ambulatory Visit: Payer: 59

## 2012-05-03 ENCOUNTER — Encounter: Payer: Self-pay | Admitting: Radiation Oncology

## 2012-05-03 ENCOUNTER — Ambulatory Visit
Admission: RE | Admit: 2012-05-03 | Discharge: 2012-05-03 | Disposition: A | Discharge status: Home / Self Care | Payer: 59 | Source: Ambulatory Visit | Attending: Radiation Oncology | Admitting: Radiation Oncology

## 2012-05-03 ENCOUNTER — Ambulatory Visit: Payer: 59

## 2012-05-03 VITALS — BP 148/59 | HR 88 | Temp 97.5°F | Ht 72.0 in | Wt 265.8 lb

## 2012-05-03 DIAGNOSIS — R3915 Urgency of urination: Secondary | ICD-10-CM | POA: Diagnosis not present

## 2012-05-03 DIAGNOSIS — C61 Malignant neoplasm of prostate: Secondary | ICD-10-CM

## 2012-05-03 DIAGNOSIS — Z510 Encounter for antineoplastic radiation therapy: Secondary | ICD-10-CM | POA: Diagnosis not present

## 2012-05-03 DIAGNOSIS — R351 Nocturia: Secondary | ICD-10-CM | POA: Diagnosis not present

## 2012-05-03 DIAGNOSIS — R350 Frequency of micturition: Secondary | ICD-10-CM | POA: Diagnosis not present

## 2012-05-03 NOTE — Progress Notes (Signed)
Weekly Management Note:  Site: Prostate Current Dose:  3315  cGy Projected Dose: 7800  cGy  Narrative: The patient is seen today for routine under treatment assessment. CBCT/MVCT images/port films were reviewed. The chart was reviewed.   Bladder filling is satisfactory. No new GU or GI difficulties. He does have mild urinary urgency and frequency.  Physical Examination:  Filed Vitals:   05/03/12 0925  BP: 148/59  Pulse: 88  Temp: 97.5 F (36.4 C)  .  Weight: 265 lb 12.8 oz (120.566 kg). No change.  Impression: Tolerating radiation therapy well.  Plan: Continue radiation therapy as planned.

## 2012-05-03 NOTE — Progress Notes (Addendum)
Christian Estrada is here for his weekly put visit.  He denies pain and fatigue.  He does have urinary frequency and urgency.  He is getting up once per night to urinate.  He has noticed that his stools have been getting softer but they are not like diarrhea.  He has been using radiaplex gel occassionally for dry skin on his abdomen.  He was given the radiation therapy and you booklet and educated on the possible side effects of radiation treatment including fatigue, skin changes, diarrhea and following a high protein diet.  He was previously given radiaplex gel and he has been using it as needed on his abdomen.

## 2012-05-04 ENCOUNTER — Ambulatory Visit
Admission: RE | Admit: 2012-05-04 | Discharge: 2012-05-04 | Disposition: A | Discharge status: Home / Self Care | Payer: 59 | Source: Ambulatory Visit | Attending: Radiation Oncology | Admitting: Radiation Oncology

## 2012-05-04 ENCOUNTER — Ambulatory Visit: Payer: 59

## 2012-05-04 DIAGNOSIS — R3915 Urgency of urination: Secondary | ICD-10-CM | POA: Diagnosis not present

## 2012-05-04 DIAGNOSIS — Z510 Encounter for antineoplastic radiation therapy: Secondary | ICD-10-CM | POA: Diagnosis not present

## 2012-05-04 DIAGNOSIS — R351 Nocturia: Secondary | ICD-10-CM | POA: Diagnosis not present

## 2012-05-04 DIAGNOSIS — C61 Malignant neoplasm of prostate: Secondary | ICD-10-CM | POA: Diagnosis not present

## 2012-05-04 DIAGNOSIS — R350 Frequency of micturition: Secondary | ICD-10-CM | POA: Diagnosis not present

## 2012-05-05 ENCOUNTER — Ambulatory Visit
Admission: RE | Admit: 2012-05-05 | Discharge: 2012-05-05 | Disposition: A | Discharge status: Home / Self Care | Payer: 59 | Source: Ambulatory Visit | Attending: Radiation Oncology | Admitting: Radiation Oncology

## 2012-05-05 ENCOUNTER — Ambulatory Visit: Payer: 59

## 2012-05-05 DIAGNOSIS — R350 Frequency of micturition: Secondary | ICD-10-CM | POA: Diagnosis not present

## 2012-05-05 DIAGNOSIS — Z510 Encounter for antineoplastic radiation therapy: Secondary | ICD-10-CM | POA: Diagnosis not present

## 2012-05-05 DIAGNOSIS — R351 Nocturia: Secondary | ICD-10-CM | POA: Diagnosis not present

## 2012-05-05 DIAGNOSIS — C61 Malignant neoplasm of prostate: Secondary | ICD-10-CM | POA: Diagnosis not present

## 2012-05-05 DIAGNOSIS — R3915 Urgency of urination: Secondary | ICD-10-CM | POA: Diagnosis not present

## 2012-05-06 ENCOUNTER — Ambulatory Visit: Payer: 59

## 2012-05-06 ENCOUNTER — Ambulatory Visit
Admission: RE | Admit: 2012-05-06 | Discharge: 2012-05-06 | Disposition: A | Discharge status: Home / Self Care | Payer: 59 | Source: Ambulatory Visit | Attending: Radiation Oncology | Admitting: Radiation Oncology

## 2012-05-06 DIAGNOSIS — C61 Malignant neoplasm of prostate: Secondary | ICD-10-CM | POA: Diagnosis not present

## 2012-05-06 DIAGNOSIS — R3915 Urgency of urination: Secondary | ICD-10-CM | POA: Diagnosis not present

## 2012-05-06 DIAGNOSIS — R351 Nocturia: Secondary | ICD-10-CM | POA: Diagnosis not present

## 2012-05-06 DIAGNOSIS — R350 Frequency of micturition: Secondary | ICD-10-CM | POA: Diagnosis not present

## 2012-05-06 DIAGNOSIS — Z510 Encounter for antineoplastic radiation therapy: Secondary | ICD-10-CM | POA: Diagnosis not present

## 2012-05-09 ENCOUNTER — Ambulatory Visit
Admission: RE | Admit: 2012-05-09 | Discharge: 2012-05-09 | Disposition: A | Discharge status: Home / Self Care | Payer: 59 | Source: Ambulatory Visit | Attending: Radiation Oncology | Admitting: Radiation Oncology

## 2012-05-09 ENCOUNTER — Ambulatory Visit: Payer: 59

## 2012-05-09 DIAGNOSIS — C61 Malignant neoplasm of prostate: Secondary | ICD-10-CM | POA: Diagnosis not present

## 2012-05-09 DIAGNOSIS — Z510 Encounter for antineoplastic radiation therapy: Secondary | ICD-10-CM | POA: Diagnosis not present

## 2012-05-09 DIAGNOSIS — R351 Nocturia: Secondary | ICD-10-CM | POA: Diagnosis not present

## 2012-05-09 DIAGNOSIS — R3915 Urgency of urination: Secondary | ICD-10-CM | POA: Diagnosis not present

## 2012-05-09 DIAGNOSIS — R350 Frequency of micturition: Secondary | ICD-10-CM | POA: Diagnosis not present

## 2012-05-10 ENCOUNTER — Ambulatory Visit
Admission: RE | Admit: 2012-05-10 | Discharge: 2012-05-10 | Disposition: A | Discharge status: Home / Self Care | Payer: 59 | Source: Ambulatory Visit | Attending: Radiation Oncology | Admitting: Radiation Oncology

## 2012-05-10 ENCOUNTER — Ambulatory Visit: Payer: 59

## 2012-05-10 VITALS — BP 135/71 | HR 91 | Temp 97.8°F | Ht 72.0 in | Wt 265.1 lb

## 2012-05-10 DIAGNOSIS — Z510 Encounter for antineoplastic radiation therapy: Secondary | ICD-10-CM | POA: Diagnosis not present

## 2012-05-10 DIAGNOSIS — R3915 Urgency of urination: Secondary | ICD-10-CM | POA: Diagnosis not present

## 2012-05-10 DIAGNOSIS — R351 Nocturia: Secondary | ICD-10-CM | POA: Diagnosis not present

## 2012-05-10 DIAGNOSIS — R350 Frequency of micturition: Secondary | ICD-10-CM | POA: Diagnosis not present

## 2012-05-10 DIAGNOSIS — C61 Malignant neoplasm of prostate: Secondary | ICD-10-CM | POA: Diagnosis not present

## 2012-05-10 NOTE — Progress Notes (Signed)
Weekly Management Note:  Site: Prostate Current Dose:  4290  cGy Projected Dose: 7800  cGy  Narrative: The patient is seen today for routine under treatment assessment. CBCT/MVCT images/port films were reviewed. The chart was reviewed.   Bladder filling has been satisfactory. No new GU or GI difficulties.  Physical Examination:  Filed Vitals:   05/10/12 0929  BP: 135/71  Pulse: 91  Temp: 97.8 F (36.6 C)  .  Weight: 265 lb 1.6 oz (120.249 kg). No change.  Impression: Tolerating radiation therapy well.  Plan: Continue radiation therapy as planned.

## 2012-05-10 NOTE — Progress Notes (Signed)
Christian Estrada here for weekly put visit with his wife.  He has had 22 fractions to his prostate.  He denies pain, fatigue, heamturia and diarrhea.  He does have urinary frequency and urgency during the day.  He continues to use Radiaplex on his abdomen and states that his skin is intact.

## 2012-05-11 ENCOUNTER — Ambulatory Visit: Payer: 59

## 2012-05-11 ENCOUNTER — Ambulatory Visit
Admission: RE | Admit: 2012-05-11 | Discharge: 2012-05-11 | Disposition: A | Discharge status: Home / Self Care | Payer: 59 | Source: Ambulatory Visit | Attending: Radiation Oncology | Admitting: Radiation Oncology

## 2012-05-11 DIAGNOSIS — R3915 Urgency of urination: Secondary | ICD-10-CM | POA: Diagnosis not present

## 2012-05-11 DIAGNOSIS — R351 Nocturia: Secondary | ICD-10-CM | POA: Diagnosis not present

## 2012-05-11 DIAGNOSIS — Z510 Encounter for antineoplastic radiation therapy: Secondary | ICD-10-CM | POA: Diagnosis not present

## 2012-05-11 DIAGNOSIS — C61 Malignant neoplasm of prostate: Secondary | ICD-10-CM | POA: Diagnosis not present

## 2012-05-11 DIAGNOSIS — R350 Frequency of micturition: Secondary | ICD-10-CM | POA: Diagnosis not present

## 2012-05-12 ENCOUNTER — Ambulatory Visit
Admission: RE | Admit: 2012-05-12 | Discharge: 2012-05-12 | Disposition: A | Discharge status: Home / Self Care | Payer: 59 | Source: Ambulatory Visit | Attending: Radiation Oncology | Admitting: Radiation Oncology

## 2012-05-12 ENCOUNTER — Ambulatory Visit: Payer: 59

## 2012-05-12 DIAGNOSIS — R350 Frequency of micturition: Secondary | ICD-10-CM | POA: Diagnosis not present

## 2012-05-12 DIAGNOSIS — R351 Nocturia: Secondary | ICD-10-CM | POA: Diagnosis not present

## 2012-05-12 DIAGNOSIS — C61 Malignant neoplasm of prostate: Secondary | ICD-10-CM | POA: Diagnosis not present

## 2012-05-12 DIAGNOSIS — R3915 Urgency of urination: Secondary | ICD-10-CM | POA: Diagnosis not present

## 2012-05-12 DIAGNOSIS — Z510 Encounter for antineoplastic radiation therapy: Secondary | ICD-10-CM | POA: Diagnosis not present

## 2012-05-13 ENCOUNTER — Ambulatory Visit: Payer: 59

## 2012-05-13 ENCOUNTER — Ambulatory Visit
Admission: RE | Admit: 2012-05-13 | Discharge: 2012-05-13 | Disposition: A | Discharge status: Home / Self Care | Payer: 59 | Source: Ambulatory Visit | Attending: Radiation Oncology | Admitting: Radiation Oncology

## 2012-05-13 DIAGNOSIS — R3915 Urgency of urination: Secondary | ICD-10-CM | POA: Diagnosis not present

## 2012-05-13 DIAGNOSIS — R351 Nocturia: Secondary | ICD-10-CM | POA: Diagnosis not present

## 2012-05-13 DIAGNOSIS — Z510 Encounter for antineoplastic radiation therapy: Secondary | ICD-10-CM | POA: Diagnosis not present

## 2012-05-13 DIAGNOSIS — R350 Frequency of micturition: Secondary | ICD-10-CM | POA: Diagnosis not present

## 2012-05-13 DIAGNOSIS — C61 Malignant neoplasm of prostate: Secondary | ICD-10-CM | POA: Diagnosis not present

## 2012-05-16 ENCOUNTER — Ambulatory Visit
Admission: RE | Admit: 2012-05-16 | Discharge: 2012-05-16 | Disposition: A | Discharge status: Home / Self Care | Payer: 59 | Source: Ambulatory Visit | Attending: Radiation Oncology | Admitting: Radiation Oncology

## 2012-05-16 ENCOUNTER — Ambulatory Visit: Payer: 59

## 2012-05-16 VITALS — BP 151/57 | HR 89 | Temp 97.6°F | Ht 72.0 in | Wt 267.4 lb

## 2012-05-16 DIAGNOSIS — C61 Malignant neoplasm of prostate: Secondary | ICD-10-CM

## 2012-05-16 DIAGNOSIS — R351 Nocturia: Secondary | ICD-10-CM | POA: Diagnosis not present

## 2012-05-16 DIAGNOSIS — Z510 Encounter for antineoplastic radiation therapy: Secondary | ICD-10-CM | POA: Diagnosis not present

## 2012-05-16 DIAGNOSIS — R3915 Urgency of urination: Secondary | ICD-10-CM | POA: Diagnosis not present

## 2012-05-16 DIAGNOSIS — R350 Frequency of micturition: Secondary | ICD-10-CM | POA: Diagnosis not present

## 2012-05-16 NOTE — Progress Notes (Signed)
Mr. Christian Estrada here for weekly put visit.  He has had 26/40 fractions to his prostate.  He denies pain, loss of appetite, hematuria and fatigue.  He is getting up 2 times during the night to urinate.  He did have diarrhea yesterday.

## 2012-05-16 NOTE — Progress Notes (Signed)
Weekly Management Note:  Site: Prostate Current Dose:  5070  cGy Projected Dose: 7800  cGy  Narrative: The patient is seen today for routine under treatment assessment. CBCT/MVCT images/port films were reviewed. The chart was reviewed.   Bladder filling is excellent. He does have some loosening of his bowels, otherwise he is doing well from a GU and GI standpoint.  Physical Examination:  Filed Vitals:   05/16/12 0952  BP: 151/57  Pulse: 89  Temp: 97.6 F (36.4 C)  .  Weight: 267 lb 6.4 oz (121.292 kg). No change.  Impression: Tolerating radiation therapy well. I told that he may take Imodium when necessary. He is to avoid constipation.  Plan: Continue radiation therapy as planned.

## 2012-05-17 ENCOUNTER — Ambulatory Visit
Admission: RE | Admit: 2012-05-17 | Discharge: 2012-05-17 | Disposition: A | Discharge status: Home / Self Care | Payer: 59 | Source: Ambulatory Visit | Attending: Radiation Oncology | Admitting: Radiation Oncology

## 2012-05-17 ENCOUNTER — Ambulatory Visit: Payer: 59

## 2012-05-17 DIAGNOSIS — R351 Nocturia: Secondary | ICD-10-CM | POA: Diagnosis not present

## 2012-05-17 DIAGNOSIS — R350 Frequency of micturition: Secondary | ICD-10-CM | POA: Diagnosis not present

## 2012-05-17 DIAGNOSIS — C61 Malignant neoplasm of prostate: Secondary | ICD-10-CM | POA: Diagnosis not present

## 2012-05-17 DIAGNOSIS — R3915 Urgency of urination: Secondary | ICD-10-CM | POA: Diagnosis not present

## 2012-05-17 DIAGNOSIS — Z510 Encounter for antineoplastic radiation therapy: Secondary | ICD-10-CM | POA: Diagnosis not present

## 2012-05-18 ENCOUNTER — Ambulatory Visit
Admission: RE | Admit: 2012-05-18 | Discharge: 2012-05-18 | Disposition: A | Discharge status: Home / Self Care | Payer: 59 | Source: Ambulatory Visit | Attending: Radiation Oncology | Admitting: Radiation Oncology

## 2012-05-18 ENCOUNTER — Ambulatory Visit: Payer: 59

## 2012-05-18 DIAGNOSIS — R350 Frequency of micturition: Secondary | ICD-10-CM | POA: Diagnosis not present

## 2012-05-18 DIAGNOSIS — Z510 Encounter for antineoplastic radiation therapy: Secondary | ICD-10-CM | POA: Diagnosis not present

## 2012-05-18 DIAGNOSIS — C61 Malignant neoplasm of prostate: Secondary | ICD-10-CM | POA: Diagnosis not present

## 2012-05-18 DIAGNOSIS — R3915 Urgency of urination: Secondary | ICD-10-CM | POA: Diagnosis not present

## 2012-05-18 DIAGNOSIS — R351 Nocturia: Secondary | ICD-10-CM | POA: Diagnosis not present

## 2012-05-19 ENCOUNTER — Ambulatory Visit
Admission: RE | Admit: 2012-05-19 | Discharge: 2012-05-19 | Disposition: A | Discharge status: Home / Self Care | Payer: 59 | Source: Ambulatory Visit | Attending: Radiation Oncology | Admitting: Radiation Oncology

## 2012-05-19 ENCOUNTER — Ambulatory Visit: Payer: 59

## 2012-05-19 DIAGNOSIS — C61 Malignant neoplasm of prostate: Secondary | ICD-10-CM | POA: Diagnosis not present

## 2012-05-19 DIAGNOSIS — Z510 Encounter for antineoplastic radiation therapy: Secondary | ICD-10-CM | POA: Diagnosis not present

## 2012-05-19 DIAGNOSIS — R350 Frequency of micturition: Secondary | ICD-10-CM | POA: Diagnosis not present

## 2012-05-19 DIAGNOSIS — R3915 Urgency of urination: Secondary | ICD-10-CM | POA: Diagnosis not present

## 2012-05-19 DIAGNOSIS — R351 Nocturia: Secondary | ICD-10-CM | POA: Diagnosis not present

## 2012-05-20 ENCOUNTER — Ambulatory Visit
Admission: RE | Admit: 2012-05-20 | Discharge: 2012-05-20 | Disposition: A | Discharge status: Home / Self Care | Payer: 59 | Source: Ambulatory Visit | Attending: Radiation Oncology | Admitting: Radiation Oncology

## 2012-05-20 DIAGNOSIS — C61 Malignant neoplasm of prostate: Secondary | ICD-10-CM | POA: Diagnosis not present

## 2012-05-20 DIAGNOSIS — R3915 Urgency of urination: Secondary | ICD-10-CM | POA: Diagnosis not present

## 2012-05-20 DIAGNOSIS — R350 Frequency of micturition: Secondary | ICD-10-CM | POA: Diagnosis not present

## 2012-05-20 DIAGNOSIS — R351 Nocturia: Secondary | ICD-10-CM | POA: Diagnosis not present

## 2012-05-20 DIAGNOSIS — Z510 Encounter for antineoplastic radiation therapy: Secondary | ICD-10-CM | POA: Diagnosis not present

## 2012-05-23 ENCOUNTER — Ambulatory Visit: Payer: 59

## 2012-05-23 ENCOUNTER — Ambulatory Visit
Admission: RE | Admit: 2012-05-23 | Discharge: 2012-05-23 | Disposition: A | Discharge status: Home / Self Care | Payer: 59 | Source: Ambulatory Visit | Attending: Radiation Oncology | Admitting: Radiation Oncology

## 2012-05-23 DIAGNOSIS — C61 Malignant neoplasm of prostate: Secondary | ICD-10-CM | POA: Diagnosis not present

## 2012-05-23 DIAGNOSIS — R3915 Urgency of urination: Secondary | ICD-10-CM | POA: Diagnosis not present

## 2012-05-23 DIAGNOSIS — Z510 Encounter for antineoplastic radiation therapy: Secondary | ICD-10-CM | POA: Diagnosis not present

## 2012-05-23 DIAGNOSIS — R350 Frequency of micturition: Secondary | ICD-10-CM | POA: Diagnosis not present

## 2012-05-23 DIAGNOSIS — R351 Nocturia: Secondary | ICD-10-CM | POA: Diagnosis not present

## 2012-05-24 ENCOUNTER — Ambulatory Visit: Payer: 59

## 2012-05-24 ENCOUNTER — Ambulatory Visit
Admission: RE | Admit: 2012-05-24 | Discharge: 2012-05-24 | Disposition: A | Discharge status: Home / Self Care | Payer: 59 | Source: Ambulatory Visit | Attending: Radiation Oncology | Admitting: Radiation Oncology

## 2012-05-24 DIAGNOSIS — C61 Malignant neoplasm of prostate: Secondary | ICD-10-CM | POA: Diagnosis not present

## 2012-05-24 DIAGNOSIS — Z510 Encounter for antineoplastic radiation therapy: Secondary | ICD-10-CM | POA: Diagnosis not present

## 2012-05-24 DIAGNOSIS — R3915 Urgency of urination: Secondary | ICD-10-CM | POA: Diagnosis not present

## 2012-05-24 DIAGNOSIS — R351 Nocturia: Secondary | ICD-10-CM | POA: Diagnosis not present

## 2012-05-24 DIAGNOSIS — R350 Frequency of micturition: Secondary | ICD-10-CM | POA: Diagnosis not present

## 2012-05-25 ENCOUNTER — Ambulatory Visit
Admission: RE | Admit: 2012-05-25 | Discharge: 2012-05-25 | Disposition: A | Discharge status: Home / Self Care | Payer: 59 | Source: Ambulatory Visit | Attending: Radiation Oncology | Admitting: Radiation Oncology

## 2012-05-25 ENCOUNTER — Encounter: Payer: Self-pay | Admitting: Radiation Oncology

## 2012-05-25 ENCOUNTER — Ambulatory Visit: Payer: 59

## 2012-05-25 VITALS — BP 138/83 | HR 90 | Temp 97.7°F | Resp 20 | Wt 271.9 lb

## 2012-05-25 DIAGNOSIS — R350 Frequency of micturition: Secondary | ICD-10-CM | POA: Diagnosis not present

## 2012-05-25 DIAGNOSIS — C61 Malignant neoplasm of prostate: Secondary | ICD-10-CM | POA: Diagnosis not present

## 2012-05-25 DIAGNOSIS — Z510 Encounter for antineoplastic radiation therapy: Secondary | ICD-10-CM | POA: Diagnosis not present

## 2012-05-25 DIAGNOSIS — R3915 Urgency of urination: Secondary | ICD-10-CM | POA: Diagnosis not present

## 2012-05-25 DIAGNOSIS — R351 Nocturia: Secondary | ICD-10-CM | POA: Diagnosis not present

## 2012-05-25 NOTE — Progress Notes (Addendum)
Patient here weekly rad tx 33/40 completed, no dysuria, nocturia 1-2x , some loose stools, encouraged more water intake, protein diet, decrease  fried/greasy foods, no fatigue Doing well 9:50 AM

## 2012-05-25 NOTE — Progress Notes (Signed)
Weekly Management Note:  Site: Prostate Current Dose:  6435  cGy Projected Dose: 7800  cGy  Narrative: The patient is seen today for routine under treatment assessment. CBCT/MVCT images/port films were reviewed. The chart was reviewed.   Satisfactory bladder filling. Doing well from a GU and GI standpoint.  Physical Examination:  Filed Vitals:   05/25/12 0948  BP: 138/83  Pulse: 90  Temp: 97.7 F (36.5 C)  Resp: 20  .  Weight: 271 lb 14.4 oz (123.333 kg). No change.  Impression: Tolerating radiation therapy well.  Plan: Continue radiation therapy as planned.

## 2012-05-26 ENCOUNTER — Ambulatory Visit: Payer: 59

## 2012-05-26 ENCOUNTER — Ambulatory Visit
Admission: RE | Admit: 2012-05-26 | Discharge: 2012-05-26 | Disposition: A | Discharge status: Home / Self Care | Payer: 59 | Source: Ambulatory Visit | Attending: Radiation Oncology | Admitting: Radiation Oncology

## 2012-05-26 DIAGNOSIS — R351 Nocturia: Secondary | ICD-10-CM | POA: Diagnosis not present

## 2012-05-26 DIAGNOSIS — C61 Malignant neoplasm of prostate: Secondary | ICD-10-CM | POA: Diagnosis not present

## 2012-05-26 DIAGNOSIS — R3915 Urgency of urination: Secondary | ICD-10-CM | POA: Diagnosis not present

## 2012-05-26 DIAGNOSIS — R350 Frequency of micturition: Secondary | ICD-10-CM | POA: Diagnosis not present

## 2012-05-26 DIAGNOSIS — Z510 Encounter for antineoplastic radiation therapy: Secondary | ICD-10-CM | POA: Diagnosis not present

## 2012-05-27 ENCOUNTER — Ambulatory Visit
Admission: RE | Admit: 2012-05-27 | Discharge: 2012-05-27 | Disposition: A | Discharge status: Home / Self Care | Payer: 59 | Source: Ambulatory Visit | Attending: Radiation Oncology | Admitting: Radiation Oncology

## 2012-05-27 DIAGNOSIS — R350 Frequency of micturition: Secondary | ICD-10-CM | POA: Diagnosis not present

## 2012-05-27 DIAGNOSIS — R3915 Urgency of urination: Secondary | ICD-10-CM | POA: Diagnosis not present

## 2012-05-27 DIAGNOSIS — R351 Nocturia: Secondary | ICD-10-CM | POA: Diagnosis not present

## 2012-05-27 DIAGNOSIS — Z510 Encounter for antineoplastic radiation therapy: Secondary | ICD-10-CM | POA: Diagnosis not present

## 2012-05-27 DIAGNOSIS — C61 Malignant neoplasm of prostate: Secondary | ICD-10-CM | POA: Diagnosis not present

## 2012-05-30 ENCOUNTER — Ambulatory Visit
Admission: RE | Admit: 2012-05-30 | Discharge: 2012-05-30 | Disposition: A | Discharge status: Home / Self Care | Payer: 59 | Source: Ambulatory Visit | Attending: Radiation Oncology | Admitting: Radiation Oncology

## 2012-05-30 ENCOUNTER — Ambulatory Visit: Payer: 59

## 2012-05-30 ENCOUNTER — Encounter: Payer: Self-pay | Admitting: Radiation Oncology

## 2012-05-30 VITALS — BP 145/61 | HR 90 | Temp 98.8°F | Resp 20 | Wt 267.7 lb

## 2012-05-30 DIAGNOSIS — R3915 Urgency of urination: Secondary | ICD-10-CM | POA: Diagnosis not present

## 2012-05-30 DIAGNOSIS — C61 Malignant neoplasm of prostate: Secondary | ICD-10-CM

## 2012-05-30 DIAGNOSIS — R351 Nocturia: Secondary | ICD-10-CM | POA: Diagnosis not present

## 2012-05-30 DIAGNOSIS — Z510 Encounter for antineoplastic radiation therapy: Secondary | ICD-10-CM | POA: Diagnosis not present

## 2012-05-30 DIAGNOSIS — R350 Frequency of micturition: Secondary | ICD-10-CM | POA: Diagnosis not present

## 2012-05-30 NOTE — Progress Notes (Signed)
Weekly Management Note:  Site: Prostate Current Dose:  7020  cGy Projected Dose: 7800  cGy  Narrative: The patient is seen today for routine under treatment assessment. CBCT/MVCT images/port films were reviewed. The chart was reviewed.   Bladder filling is less than ideal today. He had urinate prior to his treatment today. Some increasing urinary frequency, but no significant GU or GI difficulties.  Physical Examination:  Filed Vitals:   05/30/12 0935  BP: 145/61  Pulse: 90  Temp: 98.8 F (37.1 C)  Resp: 20  .  Weight: 267 lb 11.2 oz (121.428 kg). No change.  Impression: Tolerating radiation therapy well.  Plan: Continue radiation therapy as planned. He finishes his radiation therapy this Friday. One-month followup after completion of therapy.

## 2012-05-30 NOTE — Progress Notes (Signed)
Pt denies pain, fatigue, loss of appetite, bowel issues. He states he has increased urinary frequency, nocturia "1-2 x more often than he used to". He denies other urinary issues, changes.

## 2012-05-31 ENCOUNTER — Ambulatory Visit: Payer: 59

## 2012-05-31 ENCOUNTER — Ambulatory Visit
Admission: RE | Admit: 2012-05-31 | Discharge: 2012-05-31 | Disposition: A | Discharge status: Home / Self Care | Payer: 59 | Source: Ambulatory Visit | Attending: Radiation Oncology | Admitting: Radiation Oncology

## 2012-05-31 DIAGNOSIS — R350 Frequency of micturition: Secondary | ICD-10-CM | POA: Diagnosis not present

## 2012-05-31 DIAGNOSIS — R351 Nocturia: Secondary | ICD-10-CM | POA: Diagnosis not present

## 2012-05-31 DIAGNOSIS — C61 Malignant neoplasm of prostate: Secondary | ICD-10-CM | POA: Diagnosis not present

## 2012-05-31 DIAGNOSIS — Z510 Encounter for antineoplastic radiation therapy: Secondary | ICD-10-CM | POA: Diagnosis not present

## 2012-05-31 DIAGNOSIS — R3915 Urgency of urination: Secondary | ICD-10-CM | POA: Diagnosis not present

## 2012-06-01 ENCOUNTER — Ambulatory Visit
Admission: RE | Admit: 2012-06-01 | Discharge: 2012-06-01 | Disposition: A | Discharge status: Home / Self Care | Payer: 59 | Source: Ambulatory Visit | Attending: Radiation Oncology | Admitting: Radiation Oncology

## 2012-06-01 ENCOUNTER — Ambulatory Visit: Payer: 59

## 2012-06-01 DIAGNOSIS — C61 Malignant neoplasm of prostate: Secondary | ICD-10-CM | POA: Diagnosis not present

## 2012-06-01 DIAGNOSIS — R3915 Urgency of urination: Secondary | ICD-10-CM | POA: Diagnosis not present

## 2012-06-01 DIAGNOSIS — R351 Nocturia: Secondary | ICD-10-CM | POA: Diagnosis not present

## 2012-06-01 DIAGNOSIS — R350 Frequency of micturition: Secondary | ICD-10-CM | POA: Diagnosis not present

## 2012-06-01 DIAGNOSIS — Z510 Encounter for antineoplastic radiation therapy: Secondary | ICD-10-CM | POA: Diagnosis not present

## 2012-06-02 ENCOUNTER — Ambulatory Visit: Payer: 59

## 2012-06-02 ENCOUNTER — Ambulatory Visit
Admission: RE | Admit: 2012-06-02 | Discharge: 2012-06-02 | Disposition: A | Discharge status: Home / Self Care | Payer: 59 | Source: Ambulatory Visit | Attending: Radiation Oncology | Admitting: Radiation Oncology

## 2012-06-02 DIAGNOSIS — R351 Nocturia: Secondary | ICD-10-CM | POA: Diagnosis not present

## 2012-06-02 DIAGNOSIS — R3915 Urgency of urination: Secondary | ICD-10-CM | POA: Diagnosis not present

## 2012-06-02 DIAGNOSIS — Z510 Encounter for antineoplastic radiation therapy: Secondary | ICD-10-CM | POA: Diagnosis not present

## 2012-06-02 DIAGNOSIS — R350 Frequency of micturition: Secondary | ICD-10-CM | POA: Diagnosis not present

## 2012-06-02 DIAGNOSIS — C61 Malignant neoplasm of prostate: Secondary | ICD-10-CM | POA: Diagnosis not present

## 2012-06-03 ENCOUNTER — Ambulatory Visit
Admission: RE | Admit: 2012-06-03 | Discharge: 2012-06-03 | Disposition: A | Discharge status: Home / Self Care | Payer: 59 | Source: Ambulatory Visit | Attending: Radiation Oncology | Admitting: Radiation Oncology

## 2012-06-03 DIAGNOSIS — C61 Malignant neoplasm of prostate: Secondary | ICD-10-CM | POA: Diagnosis not present

## 2012-06-03 DIAGNOSIS — R350 Frequency of micturition: Secondary | ICD-10-CM | POA: Diagnosis not present

## 2012-06-03 DIAGNOSIS — R3915 Urgency of urination: Secondary | ICD-10-CM | POA: Diagnosis not present

## 2012-06-03 DIAGNOSIS — R351 Nocturia: Secondary | ICD-10-CM | POA: Diagnosis not present

## 2012-06-03 DIAGNOSIS — Z510 Encounter for antineoplastic radiation therapy: Secondary | ICD-10-CM | POA: Diagnosis not present

## 2012-06-04 ENCOUNTER — Encounter: Payer: Self-pay | Admitting: Radiation Oncology

## 2012-06-04 NOTE — Progress Notes (Signed)
[facility] Radiation Oncology End of Treatment Note  Name:Christian Estrada  Date: 06/04/2012 WUJ:[id number] DOB:[birth year]   Status:outpatient    CC: Dr. Su Grand, Dr. Trula Slade  REFERRING PHYSICIAN:   Dr. Su Grand   DIAGNOSIS:  Stage TI C. intermediate risk adenocarcinoma prostate  INDICATION FOR TREATMENT: Curative   TREATMENT DATES: 04/07/2012 through 06/04/2038                          SITE/DOSE: Prostate 7800 cGy 40 sessions, seminal vesicles 5600 cGy 40 sessions                          BEAMS/ENERGY:   6 MV photons, dual modulated arcs/IMRT                NARRATIVE:   [name] tolerated his treatment quite well with no significant GU or GI toxicity by completion of therapy. He can be comfortable full bladder during his course of therapy to minimize urinary toxicity.                         PLAN: Routine followup in one month. Patient instructed to call if questions or worsening complaints in interim.

## 2012-06-04 NOTE — Progress Notes (Signed)
IMRT simulation/treatment planning: On 03/29/2012 Mr. Range underwent IMRT simulation/treatment planning in the management of his carcinoma the prostate. IMRT was chosen to decrease the risk for both acute and late rectal and bladder toxicity compared to 3-D conformal or conventional radiation therapy. He was set up to 2 modulated arcs. Dose volume histograms were obtained and carefully reviewed for the prostate, seminal vesicles and also avoidance structures including the bladder, rectum, and femoral heads. We met our departmental guidelines. I'm prescribing 7800 cGy in 40 sessions to his prostate PTV and 5600 cGy to his seminal vesicle PTV. Please see my prescription for specific structure target expansions.

## 2012-06-04 NOTE — Progress Notes (Signed)
IMRT treatment device note: One set of IMRT treatment devices was designed and employed on the first day of treatment, 04/07/2012. He was treated with 2 modulated arcs with dynamic MLCs representing one set of IMRT treatment devices.

## 2012-06-22 DIAGNOSIS — E119 Type 2 diabetes mellitus without complications: Secondary | ICD-10-CM | POA: Diagnosis not present

## 2012-06-22 DIAGNOSIS — D649 Anemia, unspecified: Secondary | ICD-10-CM | POA: Diagnosis not present

## 2012-06-22 DIAGNOSIS — C61 Malignant neoplasm of prostate: Secondary | ICD-10-CM | POA: Diagnosis not present

## 2012-06-22 DIAGNOSIS — E663 Overweight: Secondary | ICD-10-CM | POA: Diagnosis not present

## 2012-06-22 DIAGNOSIS — I10 Essential (primary) hypertension: Secondary | ICD-10-CM | POA: Diagnosis not present

## 2012-06-22 DIAGNOSIS — E782 Mixed hyperlipidemia: Secondary | ICD-10-CM | POA: Diagnosis not present

## 2012-07-01 ENCOUNTER — Encounter: Payer: Self-pay | Admitting: Radiation Oncology

## 2012-07-05 ENCOUNTER — Ambulatory Visit
Admission: RE | Admit: 2012-07-05 | Discharge: 2012-07-05 | Disposition: A | Discharge status: Home / Self Care | Payer: 59 | Source: Ambulatory Visit | Attending: Radiation Oncology | Admitting: Radiation Oncology

## 2012-07-05 VITALS — BP 156/76 | HR 92 | Temp 97.9°F | Wt 265.7 lb

## 2012-07-05 DIAGNOSIS — C61 Malignant neoplasm of prostate: Secondary | ICD-10-CM

## 2012-07-05 NOTE — Progress Notes (Signed)
CC: Christian Estrada  Followup note:  Christian Estrada returns today approximately 1 month following completion of external beam/IMRT in the management of his stage TI C. intermediate risk adenocarcinoma prostate. He is without GU or GI difficulties. He is back to his pre-radiation therapy baseline. He tells me he'll see Dr. Brunilda Payor for a followup visit in August.  Physical examination: Alert and oriented. Filed Vitals:   07/05/12 1106  BP: 156/76  Pulse: 92  Temp: 97.9 F (36.6 C)   Rectal examination not performed today.  Impression: Satisfactory progress.  Plan: Followup with Dr. Brunilda Payor he will see him in August. I've not scheduled the patient for a formal followup visit and I ask that Dr. Brunilda Payor keep me posted on his progress.

## 2012-07-05 NOTE — Progress Notes (Signed)
Patient here for routine follow up for prostate cancer radiation completed on 06/03/12.Denies pain.No frequency or urgency or burining on urination.Bowels normal.To follow up with Dr.Nesi in August 2014.

## 2013-03-15 DIAGNOSIS — C61 Malignant neoplasm of prostate: Secondary | ICD-10-CM | POA: Diagnosis not present

## 2013-03-22 DIAGNOSIS — C61 Malignant neoplasm of prostate: Secondary | ICD-10-CM | POA: Diagnosis not present

## 2013-06-14 DIAGNOSIS — C61 Malignant neoplasm of prostate: Secondary | ICD-10-CM | POA: Diagnosis not present

## 2013-06-21 DIAGNOSIS — C61 Malignant neoplasm of prostate: Secondary | ICD-10-CM | POA: Diagnosis not present

## 2013-09-21 DIAGNOSIS — C61 Malignant neoplasm of prostate: Secondary | ICD-10-CM | POA: Diagnosis not present

## 2013-09-28 DIAGNOSIS — C61 Malignant neoplasm of prostate: Secondary | ICD-10-CM | POA: Diagnosis not present

## 2014-01-04 DIAGNOSIS — R630 Anorexia: Secondary | ICD-10-CM | POA: Diagnosis not present

## 2014-01-04 DIAGNOSIS — C61 Malignant neoplasm of prostate: Secondary | ICD-10-CM | POA: Diagnosis not present

## 2014-01-04 DIAGNOSIS — F329 Major depressive disorder, single episode, unspecified: Secondary | ICD-10-CM | POA: Diagnosis not present

## 2014-01-08 DIAGNOSIS — C61 Malignant neoplasm of prostate: Secondary | ICD-10-CM | POA: Diagnosis not present

## 2014-04-19 DIAGNOSIS — C61 Malignant neoplasm of prostate: Secondary | ICD-10-CM | POA: Diagnosis not present

## 2014-07-25 DIAGNOSIS — C61 Malignant neoplasm of prostate: Secondary | ICD-10-CM | POA: Diagnosis not present

## 2014-11-01 DIAGNOSIS — C61 Malignant neoplasm of prostate: Secondary | ICD-10-CM | POA: Diagnosis not present

## 2014-12-11 DIAGNOSIS — Z23 Encounter for immunization: Secondary | ICD-10-CM | POA: Diagnosis not present

## 2014-12-11 DIAGNOSIS — E139 Other specified diabetes mellitus without complications: Secondary | ICD-10-CM | POA: Diagnosis not present

## 2014-12-11 DIAGNOSIS — C61 Malignant neoplasm of prostate: Secondary | ICD-10-CM | POA: Diagnosis not present

## 2014-12-11 DIAGNOSIS — Z1389 Encounter for screening for other disorder: Secondary | ICD-10-CM | POA: Diagnosis not present

## 2014-12-11 DIAGNOSIS — Z0000 Encounter for general adult medical examination without abnormal findings: Secondary | ICD-10-CM | POA: Diagnosis not present

## 2014-12-11 DIAGNOSIS — E782 Mixed hyperlipidemia: Secondary | ICD-10-CM | POA: Diagnosis not present

## 2014-12-11 DIAGNOSIS — I10 Essential (primary) hypertension: Secondary | ICD-10-CM | POA: Diagnosis not present

## 2015-04-17 DIAGNOSIS — E7800 Pure hypercholesterolemia, unspecified: Secondary | ICD-10-CM | POA: Diagnosis not present

## 2015-04-17 DIAGNOSIS — C61 Malignant neoplasm of prostate: Secondary | ICD-10-CM | POA: Diagnosis not present

## 2015-04-17 DIAGNOSIS — E139 Other specified diabetes mellitus without complications: Secondary | ICD-10-CM | POA: Diagnosis not present

## 2015-04-17 DIAGNOSIS — R011 Cardiac murmur, unspecified: Secondary | ICD-10-CM | POA: Diagnosis not present

## 2015-04-17 DIAGNOSIS — I10 Essential (primary) hypertension: Secondary | ICD-10-CM | POA: Diagnosis not present

## 2015-04-23 ENCOUNTER — Other Ambulatory Visit (HOSPITAL_COMMUNITY): Payer: Self-pay | Admitting: Internal Medicine

## 2015-04-23 DIAGNOSIS — R011 Cardiac murmur, unspecified: Secondary | ICD-10-CM

## 2015-04-25 DIAGNOSIS — C61 Malignant neoplasm of prostate: Secondary | ICD-10-CM | POA: Diagnosis not present

## 2015-05-02 DIAGNOSIS — C61 Malignant neoplasm of prostate: Secondary | ICD-10-CM | POA: Diagnosis not present

## 2015-05-02 DIAGNOSIS — R972 Elevated prostate specific antigen [PSA]: Secondary | ICD-10-CM | POA: Diagnosis not present

## 2015-05-07 ENCOUNTER — Ambulatory Visit (HOSPITAL_COMMUNITY): Payer: 59 | Attending: Cardiology

## 2015-05-07 ENCOUNTER — Other Ambulatory Visit: Payer: Self-pay

## 2015-05-07 DIAGNOSIS — R011 Cardiac murmur, unspecified: Secondary | ICD-10-CM | POA: Insufficient documentation

## 2015-05-07 DIAGNOSIS — I517 Cardiomegaly: Secondary | ICD-10-CM | POA: Insufficient documentation

## 2015-05-07 DIAGNOSIS — I340 Nonrheumatic mitral (valve) insufficiency: Secondary | ICD-10-CM | POA: Insufficient documentation

## 2015-05-07 DIAGNOSIS — I352 Nonrheumatic aortic (valve) stenosis with insufficiency: Secondary | ICD-10-CM | POA: Diagnosis not present

## 2015-10-31 DIAGNOSIS — C61 Malignant neoplasm of prostate: Secondary | ICD-10-CM | POA: Diagnosis not present

## 2016-02-21 ENCOUNTER — Inpatient Hospital Stay (HOSPITAL_COMMUNITY)
Admission: EM | Admit: 2016-02-21 | Discharge: 2016-02-26 | DRG: 481 | Disposition: A | Discharge status: Skilled Nursing Facility | Payer: Medicare Other | Attending: Internal Medicine | Admitting: Internal Medicine

## 2016-02-21 ENCOUNTER — Emergency Department (HOSPITAL_COMMUNITY): Payer: Medicare Other

## 2016-02-21 ENCOUNTER — Encounter (HOSPITAL_COMMUNITY): Payer: Self-pay | Admitting: Emergency Medicine

## 2016-02-21 ENCOUNTER — Inpatient Hospital Stay (HOSPITAL_COMMUNITY): Payer: Medicare Other

## 2016-02-21 DIAGNOSIS — D7589 Other specified diseases of blood and blood-forming organs: Secondary | ICD-10-CM | POA: Diagnosis present

## 2016-02-21 DIAGNOSIS — D62 Acute posthemorrhagic anemia: Secondary | ICD-10-CM | POA: Diagnosis not present

## 2016-02-21 DIAGNOSIS — K5900 Constipation, unspecified: Secondary | ICD-10-CM | POA: Diagnosis not present

## 2016-02-21 DIAGNOSIS — I10 Essential (primary) hypertension: Secondary | ICD-10-CM | POA: Diagnosis present

## 2016-02-21 DIAGNOSIS — W001XXA Fall from stairs and steps due to ice and snow, initial encounter: Secondary | ICD-10-CM | POA: Diagnosis present

## 2016-02-21 DIAGNOSIS — Z8546 Personal history of malignant neoplasm of prostate: Secondary | ICD-10-CM

## 2016-02-21 DIAGNOSIS — Z809 Family history of malignant neoplasm, unspecified: Secondary | ICD-10-CM | POA: Diagnosis not present

## 2016-02-21 DIAGNOSIS — K219 Gastro-esophageal reflux disease without esophagitis: Secondary | ICD-10-CM | POA: Diagnosis present

## 2016-02-21 DIAGNOSIS — Z833 Family history of diabetes mellitus: Secondary | ICD-10-CM | POA: Diagnosis not present

## 2016-02-21 DIAGNOSIS — E785 Hyperlipidemia, unspecified: Secondary | ICD-10-CM | POA: Diagnosis present

## 2016-02-21 DIAGNOSIS — Z7982 Long term (current) use of aspirin: Secondary | ICD-10-CM

## 2016-02-21 DIAGNOSIS — Z9889 Other specified postprocedural states: Secondary | ICD-10-CM

## 2016-02-21 DIAGNOSIS — Z923 Personal history of irradiation: Secondary | ICD-10-CM

## 2016-02-21 DIAGNOSIS — M25561 Pain in right knee: Secondary | ICD-10-CM | POA: Diagnosis not present

## 2016-02-21 DIAGNOSIS — Z7984 Long term (current) use of oral hypoglycemic drugs: Secondary | ICD-10-CM | POA: Diagnosis not present

## 2016-02-21 DIAGNOSIS — E119 Type 2 diabetes mellitus without complications: Secondary | ICD-10-CM | POA: Diagnosis present

## 2016-02-21 DIAGNOSIS — Y990 Civilian activity done for income or pay: Secondary | ICD-10-CM

## 2016-02-21 DIAGNOSIS — Z01811 Encounter for preprocedural respiratory examination: Secondary | ICD-10-CM

## 2016-02-21 DIAGNOSIS — Y9301 Activity, walking, marching and hiking: Secondary | ICD-10-CM | POA: Diagnosis present

## 2016-02-21 DIAGNOSIS — E7800 Pure hypercholesterolemia, unspecified: Secondary | ICD-10-CM | POA: Diagnosis present

## 2016-02-21 DIAGNOSIS — C61 Malignant neoplasm of prostate: Secondary | ICD-10-CM | POA: Diagnosis present

## 2016-02-21 DIAGNOSIS — R262 Difficulty in walking, not elsewhere classified: Secondary | ICD-10-CM

## 2016-02-21 DIAGNOSIS — S72461A Displaced supracondylar fracture with intracondylar extension of lower end of right femur, initial encounter for closed fracture: Principal | ICD-10-CM | POA: Diagnosis present

## 2016-02-21 DIAGNOSIS — E118 Type 2 diabetes mellitus with unspecified complications: Secondary | ICD-10-CM

## 2016-02-21 DIAGNOSIS — D649 Anemia, unspecified: Secondary | ICD-10-CM | POA: Diagnosis present

## 2016-02-21 DIAGNOSIS — S7291XA Unspecified fracture of right femur, initial encounter for closed fracture: Secondary | ICD-10-CM

## 2016-02-21 DIAGNOSIS — T148XXA Other injury of unspecified body region, initial encounter: Secondary | ICD-10-CM

## 2016-02-21 LAB — BASIC METABOLIC PANEL
Anion gap: 8 (ref 5–15)
BUN: 11 mg/dL (ref 6–20)
CHLORIDE: 107 mmol/L (ref 101–111)
CO2: 26 mmol/L (ref 22–32)
CREATININE: 0.94 mg/dL (ref 0.61–1.24)
Calcium: 9.4 mg/dL (ref 8.9–10.3)
GFR calc non Af Amer: >60 mL/min (ref >60)
Glucose, Bld: 160 mg/dL — ABNORMAL HIGH (ref 65–99)
Potassium: 3.9 mmol/L (ref 3.5–5.1)
SODIUM: 141 mmol/L (ref 135–145)

## 2016-02-21 LAB — PROTIME-INR
INR: 0.96
PROTHROMBIN TIME: 12.8 s (ref 11.4–15.2)

## 2016-02-21 LAB — CBC
HCT: 40.9 % (ref 39.0–52.0)
Hemoglobin: 13.7 g/dL (ref 13.0–17.0)
MCH: 32.9 pg (ref 26.0–34.0)
MCHC: 33.5 g/dL (ref 30.0–36.0)
MCV: 98.3 fL (ref 78.0–100.0)
Platelets: 167 10*3/uL (ref 150–400)
RBC: 4.16 MIL/uL — AB (ref 4.22–5.81)
RDW: 13.8 % (ref 11.5–15.5)
WBC: 13.5 10*3/uL — AB (ref 4.0–10.5)

## 2016-02-21 LAB — CBG MONITORING, ED
GLUCOSE-CAPILLARY: 175 mg/dL — AB (ref 65–99)
Glucose-Capillary: 216 mg/dL — ABNORMAL HIGH (ref 65–99)

## 2016-02-21 LAB — URINALYSIS, ROUTINE W REFLEX MICROSCOPIC
Bilirubin Urine: NEGATIVE
Glucose, UA: NEGATIVE mg/dL
HGB URINE DIPSTICK: NEGATIVE
Ketones, ur: NEGATIVE mg/dL
LEUKOCYTES UA: NEGATIVE
Nitrite: NEGATIVE
PROTEIN: NEGATIVE mg/dL
Specific Gravity, Urine: 1.024 (ref 1.005–1.030)
pH: 5 (ref 5.0–8.0)

## 2016-02-21 LAB — GLUCOSE, CAPILLARY: Glucose-Capillary: 118 mg/dL — ABNORMAL HIGH (ref 65–99)

## 2016-02-21 MED ORDER — DEXTROSE 5 % IV SOLN: 3.0000 g | INTRAVENOUS | Status: DC

## 2016-02-21 MED ORDER — ACETAMINOPHEN 650 MG RE SUPP: 650.0000 mg | Freq: Four times a day (QID) | RECTAL | Status: DC | PRN

## 2016-02-21 MED ORDER — HYDROMORPHONE HCL 2 MG/ML IJ SOLN
1.0000 mg | Freq: Once | INTRAMUSCULAR | Status: AC
  Administered 2016-02-21: 1 mg via INTRAVENOUS
  Filled 2016-02-21: qty 1

## 2016-02-21 MED ORDER — KCL IN DEXTROSE-NACL 20-5-0.45 MEQ/L-%-% IV SOLN
INTRAVENOUS | Status: DC
  Administered 2016-02-21: 14:00:00 via INTRAVENOUS
  Filled 2016-02-21 (×2): qty 1000

## 2016-02-21 MED ORDER — TRAZODONE HCL 50 MG PO TABS
25.0000 mg | ORAL_TABLET | Freq: Every evening | ORAL | Status: DC | PRN
  Filled 2016-02-21: qty 1

## 2016-02-21 MED ORDER — ONDANSETRON HCL 4 MG/2ML IJ SOLN: 4.0000 mg | Freq: Four times a day (QID) | INTRAMUSCULAR | Status: DC | PRN

## 2016-02-21 MED ORDER — SODIUM CHLORIDE 0.45 % IV SOLN: INTRAVENOUS | Status: DC

## 2016-02-21 MED ORDER — INSULIN ASPART 100 UNIT/ML ~~LOC~~ SOLN
0.0000 [IU] | Freq: Every day | SUBCUTANEOUS | Status: DC
  Administered 2016-02-23 – 2016-02-24 (×2): 2 [IU] via SUBCUTANEOUS

## 2016-02-21 MED ORDER — ACETAMINOPHEN 325 MG PO TABS: 650.0000 mg | ORAL_TABLET | Freq: Four times a day (QID) | ORAL | Status: DC | PRN

## 2016-02-21 MED ORDER — POVIDONE-IODINE 10 % EX SWAB: 2.0000 "application " | Freq: Once | CUTANEOUS | Status: DC

## 2016-02-21 MED ORDER — ONDANSETRON HCL 4 MG/2ML IJ SOLN
4.0000 mg | Freq: Once | INTRAMUSCULAR | Status: AC
  Administered 2016-02-21: 4 mg via INTRAVENOUS
  Filled 2016-02-21: qty 2

## 2016-02-21 MED ORDER — INSULIN ASPART 100 UNIT/ML ~~LOC~~ SOLN
0.0000 [IU] | Freq: Three times a day (TID) | SUBCUTANEOUS | Status: DC
  Administered 2016-02-21: 5 [IU] via SUBCUTANEOUS
  Administered 2016-02-22 – 2016-02-23 (×2): 3 [IU] via SUBCUTANEOUS
  Administered 2016-02-23: 5 [IU] via SUBCUTANEOUS
  Administered 2016-02-23 – 2016-02-24 (×4): 3 [IU] via SUBCUTANEOUS
  Administered 2016-02-25: 5 [IU] via SUBCUTANEOUS
  Administered 2016-02-25 – 2016-02-26 (×4): 3 [IU] via SUBCUTANEOUS
  Filled 2016-02-21: qty 1

## 2016-02-21 MED ORDER — HYDROMORPHONE HCL 2 MG/ML IJ SOLN: 0.5000 mg | INTRAMUSCULAR | Status: DC | PRN

## 2016-02-21 MED ORDER — ONDANSETRON HCL 4 MG PO TABS: 4.0000 mg | ORAL_TABLET | Freq: Four times a day (QID) | ORAL | Status: DC | PRN

## 2016-02-21 MED ORDER — DOCUSATE SODIUM 100 MG PO CAPS
100.0000 mg | ORAL_CAPSULE | Freq: Two times a day (BID) | ORAL | Status: DC
  Administered 2016-02-21 – 2016-02-23 (×4): 100 mg via ORAL
  Filled 2016-02-21 (×4): qty 1

## 2016-02-21 MED ORDER — HYDROMORPHONE HCL 2 MG/ML IJ SOLN
1.0000 mg | INTRAMUSCULAR | Status: DC | PRN
Start: 2016-02-21 — End: 2016-02-26
  Administered 2016-02-21 – 2016-02-22 (×4): 1 mg via INTRAVENOUS
  Filled 2016-02-21 (×4): qty 1

## 2016-02-21 MED ORDER — VANCOMYCIN HCL 10 G IV SOLR
1500.0000 mg | INTRAVENOUS | Status: AC
  Administered 2016-02-22: 1500 mg via INTRAVENOUS
  Filled 2016-02-21 (×2): qty 1500

## 2016-02-21 MED ORDER — HYDRALAZINE HCL 20 MG/ML IJ SOLN: 5.0000 mg | INTRAMUSCULAR | Status: DC | PRN

## 2016-02-21 NOTE — Consult Note (Signed)
ORTHOPAEDIC CONSULTATION  REQUESTING PHYSICIAN: Elwin Mocha, MD  Chief Complaint: Right supracondylar femur fracture  HPI: Christian Estrada is a 71 y.o. male who presents with right supracondylar femur fracture s/p mechanical fall slipping on ice PTA.  The patient endorses severe pain in the right knee and femur, that does not radiate, grinding in quality, worse with any movement, better with immobilization.  Denies LOC/fever/chills/nausea/vomiting.  Walks without assistive devices (walker, cane, wheelchair).  Does  live independently with wife.  Denies LOC, neck pain, abd pain.  Denies any other complaints.  Past Medical History:  Diagnosis Date  . Allergy    pcn  . Back pain   . BPH with obstruction/lower urinary tract symptoms   . Diabetes mellitus   . Dysrhythmia   . ED (erectile dysfunction)   . GERD (gastroesophageal reflux disease)   . Hypercholesterolemia   . Hypertension   . Prostate cancer ([place]) 01/07/12   Adenocarcinoma, gleason 4+3=7,& 3+3=6,& 3+4=7,PSA=6.08,Volume=18.95cc  . Radiation 04/07/12-06/03/12   7800 cGy,seminal vesicles 5600 cGy  . Reflux   . Shortness of breath    Past Surgical History:  Procedure Laterality Date  . CIRCUMCISION    . ESOPHAGOGASTRODUODENOSCOPY  09/11/2011   Procedure: ESOPHAGOGASTRODUODENOSCOPY (EGD);  Surgeon: [name], MD;  Location: [facility] ENDOSCOPY;  Service: Endoscopy;  Laterality: N/A;   Social History   Social History  . Marital status: Married    Spouse name: N/A  . Number of children: N/A  . Years of education: N/A   Occupational History  . RETIRED    Social History Main Topics  . Smoking status: Never Smoker  . Smokeless tobacco: Never Used  . Alcohol use No  . Drug use: No  . Sexual activity: Not Asked   Other Topics Concern  . None   Social History Narrative  . None   Family History  Problem Relation Age of Onset  . Diabetes Mother   . Cancer Brother     prostate  . Cancer Brother     prostate    Allergies  Allergen Reactions  . Penicillins Other (See Comments)    Unknown-reaction years ago   Prior to Admission medications   Medication Sig Start Date End Date Taking? Authorizing Provider  amlodipine-atorvastatin (CADUET) 10-20 MG per tablet Take 1 tablet by mouth daily.   Yes Historical Provider, MD  aspirin 81 MG tablet Take 81 mg by mouth once a week.    Yes Historical Provider, MD  Multiple Vitamin (MULTIVITAMIN WITH MINERALS) TABS Take 1 tablet by mouth every morning.   Yes Historical Provider, MD  ONGLYZA 5 MG TABS tablet Take 5 mg by mouth daily.  03/18/12  Yes Historical Provider, MD  Pioglitazone HCl-Metformin HCl (ACTOPLUS MET XR) 30-1000 MG TB24 Take 1 tablet by mouth daily with supper.   Yes Historical Provider, MD  valsartan-hydrochlorothiazide (DIOVAN-HCT) 320-12.5 MG per tablet Take 1 tablet by mouth daily.   Yes Historical Provider, MD  vitamin C (ASCORBIC ACID) 500 MG tablet Take 500 mg by mouth daily.   Yes Historical Provider, MD   Ct Knee Right Wo Contrast  Result Date: 02/21/2016 CLINICAL DATA:  Fall on ice today.  Evaluate distal femur fracture. EXAM: CT OF THE RIGHT KNEE WITHOUT CONTRAST TECHNIQUE: Multidetector CT imaging of the right knee was performed according to the standard protocol. Multiplanar CT image reconstructions were also generated. COMPARISON:  Radiographs same date. FINDINGS: Bones/Joint/Cartilage Again demonstrated is a comminuted, impacted and significantly displaced fracture of the  distal right femoral diaphysis. This fracture demonstrates up to 3.9 cm of lateral displacement and up to 3.1 cm of impaction. There is also a lateral rotary component. There is distal intercondylar extension of the fracture in the sagittal plane, extending into the intercondylar notch. This component is only mildly displaced but does involve the articular surface of the medial trochlea. There is no involvement of the weight-bearing articular surface of the femoral  condyle. The proximal tibia, proximal fibula and patella are intact. There is a small lipohemarthrosis. No large intra-articular fracture fragments are seen. Ligaments Suboptimally evaluated by CT.  The cruciate ligaments appear intact. Muscles and Tendons The extensor mechanism is intact. No large intramuscular hematoma identified. Soft tissues There is no soft tissue emphysema or foreign body. Moderate femoral popliteal atherosclerosis noted. No evidence of acute vascular injury. IMPRESSION: 1. Comminuted and significantly displaced fracture of the distal femora as described. As discussed, this fracture demonstrates distal intercondylar extension with involvement of the medial trochlea. There is no involvement of the weight-bearing articular surface. 2. Associated lipohemarthrosis. 3. No dislocation or tibial fracture. Electronically Signed   By: Richardean Sale M.D.   On: 02/21/2016 11:15   Dg Chest Port 1 View  Result Date: 02/21/2016 CLINICAL DATA:  Preoperative evaluation, history hypertension, diabetes mellitus, prostate cancer EXAM: PORTABLE CHEST 1 VIEW COMPARISON:  Portable exam 0840 hours compared 09/04/2005 FINDINGS: Normal heart size, mediastinal contours, and pulmonary vascularity. Atherosclerotic calcification aorta. Lungs clear. No pleural effusion or pneumothorax. Bones unremarkable. IMPRESSION: No acute abnormalities. Aortic atherosclerosis. Electronically Signed   By: Lavonia Dana M.D.   On: 02/21/2016 08:57   Dg Knee Right Port  Result Date: 02/21/2016 CLINICAL DATA:  Knee injury EXAM: PORTABLE RIGHT KNEE - 1-2 VIEW COMPARISON:  Portable exam 0838 hours without priors for comparison FINDINGS: Osseous demineralization. Comminuted oblique fracture of the distal RIGHT femoral metadiaphysis with posterior and lateral displacement as well as overriding and mild apex medial angulation. Additionally, a longitudinal fracture plane is identified within the distal femoral metaphyseal fragment  extending intra-articular at the intercondylar notch. Posteriorly displaced bone fragment at the fracture site. Patella, tibia, and fibula appear intact. No additional fracture or dislocation. Associated soft tissue deformity and swelling. Atherosclerotic calcifications of distal superficial femoral and popliteal arteries extending into trifurcation vessels. IMPRESSION: Comminuted displaced oblique distal RIGHT femoral metadiaphyseal fracture with intra-articular extension of a longitudinal fracture plane into the intercondylar notch at the RIGHT knee. Electronically Signed   By: Lavonia Dana M.D.   On: 02/21/2016 08:52   Dg Femur, Min 2 Views Right  Result Date: 02/21/2016 CLINICAL DATA:  Pain following fall EXAM: RIGHT FEMUR 2 VIEWS COMPARISON:  None. FINDINGS: Frontal and lateral views were obtained. There is a comminuted fracture of the distal femoral diaphysis with posterior displacement of the distal major fracture fragment with respect to the proximal fragment. There is a approximately 5 cm of overriding of fracture fragments in this area. There is a joint effusion. No other fractures. No dislocations. There is extensive arterial vascular calcification. IMPRESSION: Comminuted displaced fracture distal femoral diaphysis. Joint effusion. No dislocation. Extensive atherosclerotic vascular calcification. Electronically Signed   By: Lowella Grip III M.D.   On: 02/21/2016 10:26    All pertinent xrays, MRI, CT independently reviewed and interpreted  Positive ROS: All other systems have been reviewed and were otherwise negative with the exception of those mentioned in the HPI and as above.  Physical Exam: General: Alert, no acute distress Cardiovascular: No pedal edema Respiratory:  No cyanosis, no use of accessory musculature GI: No organomegaly, abdomen is soft and non-tender Skin: No lesions in the area of chief complaint Neurologic: Sensation intact distally Psychiatric: Patient is competent  for consent with normal mood and affect Lymphatic: No axillary or cervical lymphadenopathy  MUSCULOSKELETAL:  - pain with movement of the hip and extremity - skin intact - NVI distally - compartments soft  Assessment: Right supracondylar femur fracture  Plan: - ORIF is recommended, patient and family are aware of r/b/a and wish to proceed - consent obtained - medical optimization per primary team - surgery is planned for sat morning    Thank you for the consult and the opportunity to see Mr. Leitha Schuller. Eduard Roux, MD [place] 12:26 PM

## 2016-02-21 NOTE — ED Provider Notes (Signed)
[facility] DEPT Provider Note   CSN: [id number] Arrival date & time: 02/21/16  0735     History   Chief Complaint Chief Complaint  Patient presents with  . Knee Pain  . Fall    HPI Christian Estrada is a 71 y.o. male who presents emergency Department with chief complaint of right knee injury. His past medical history of prostate cancer, diabetes, hypertension, hypercholesterolemia. 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 He had a rotational injury at the knee. She is immediate severe pain and heard a snap and was unable to ambulate. Presented via EMS with a significant deformity of the right knee. He rates his pain at 8 out of 10. He did land on his back but denies any significant back pain, head injury or loss of consciousness.  HPI  Past Medical History:  Diagnosis Date  . Allergy    pcn  . Back pain   . BPH with obstruction/lower urinary tract symptoms   . Diabetes mellitus   . Dysrhythmia   . ED (erectile dysfunction)   . GERD (gastroesophageal reflux disease)   . Hypercholesterolemia   . Hypertension   . Prostate cancer ([place]) 01/07/12   Adenocarcinoma, gleason 4+3=7,& 3+3=6,& 3+4=7,PSA=6.08,Volume=18.95cc  . Radiation 04/07/12-06/03/12   7800 cGy,seminal vesicles 5600 cGy  . Reflux   . Shortness of breath     Patient Active Problem List   Diagnosis Date Noted  . Femoral fracture ([place]) 02/21/2016  . Reflux   . Hypertension   . Back pain   . Shortness of breath   . GERD (gastroesophageal reflux disease)   . Dysrhythmia   . Hypercholesterolemia   . BPH with obstruction/lower urinary tract symptoms   . Prostate cancer ([place]) 01/07/2012  . Anemia 09/10/2011  . Blood in stool 09/10/2011  . Diabetes mellitus ([place]) 09/10/2011  . HTN (hypertension) 09/10/2011    Past Surgical History:  Procedure Laterality Date  . CIRCUMCISION    . ESOPHAGOGASTRODUODENOSCOPY  09/11/2011   Procedure: ESOPHAGOGASTRODUODENOSCOPY (EGD);  Surgeon: [name], MD;  Location: [facility] ENDOSCOPY;  Service: Endoscopy;  Laterality: N/A;       Home Medications    Prior to Admission medications   Medication Sig Start Date End Date Taking? Authorizing Provider  amlodipine-atorvastatin (CADUET) 10-20 MG per tablet Take 1 tablet by mouth daily.   Yes Historical Provider, MD  aspirin 81 MG tablet Take 81 mg by mouth once a week.    Yes Historical Provider, MD  Multiple Vitamin (MULTIVITAMIN WITH MINERALS) TABS Take 1 tablet by mouth every morning.   Yes Historical Provider, MD  ONGLYZA 5 MG TABS tablet Take 5 mg by mouth daily.  03/18/12  Yes Historical Provider, MD  Pioglitazone HCl-Metformin HCl (ACTOPLUS MET XR) 30-1000 MG TB24 Take 1 tablet by mouth daily with supper.   Yes Historical Provider, MD  valsartan-hydrochlorothiazide (DIOVAN-HCT) 320-12.5 MG per tablet Take 1 tablet by mouth daily.   Yes Historical Provider, MD  vitamin C (ASCORBIC ACID) 500 MG tablet Take 500 mg by mouth daily.   Yes Historical Provider, MD    Family History Family History  Problem Relation Age of Onset  . Diabetes Mother   . Cancer Brother     prostate  . Cancer Brother     prostate    Social History Social History  Substance Use Topics  . Smoking status: Never Smoker  . Smokeless tobacco: Never Used  . Alcohol use No  Allergies   Penicillins   Review of Systems Review of Systems Ten systems reviewed and are negative for acute change, except as noted in the HPI.    Physical Exam Updated Vital Signs BP 113/79   Pulse 63   Temp 97.9 F (36.6 C) (Oral)   Resp 15   Ht 5' 11" (1.803 m)   Wt 120.2 kg   SpO2 97%   BMI 36.96 kg/m   Physical Exam  Constitutional: He appears well-developed and well-nourished. No distress.  HENT:  Head: Normocephalic and atraumatic.  Eyes: Conjunctivae are normal. No scleral icterus.  Neck: Normal range of motion. Neck supple.  Cardiovascular: Normal rate, regular rhythm and normal heart sounds.     Pulmonary/Chest: Effort normal and breath sounds normal. No respiratory distress.  Abdominal: Soft. There is no tenderness.  Musculoskeletal: He exhibits no edema.   Sig deformity at the right knee. The joint is facing externally. The foot is warm and well perfused with cap refill <2 and strong DP/ PT pulse.  Neurological: He is alert.  Skin: Skin is warm and dry. He is not diaphoretic.  Psychiatric: His behavior is normal.  Nursing note and vitals reviewed.    ED Treatments / Results  Labs (all labs ordered are listed, but only abnormal results are displayed) Labs Reviewed  BASIC METABOLIC PANEL - Abnormal; Notable for the following:       Result Value   Glucose, Bld 160 (*)    All other components within normal limits  CBC - Abnormal; Notable for the following:    WBC 13.5 (*)    RBC 4.16 (*)    All other components within normal limits  CBG MONITORING, ED - Abnormal; Notable for the following:    Glucose-Capillary 175 (*)    All other components within normal limits  PROTIME-INR  URINALYSIS, ROUTINE W REFLEX MICROSCOPIC  HEMOGLOBIN A1C    EKG  EKG Interpretation None       Radiology Ct Knee Right Wo Contrast  Result Date: 02/21/2016 CLINICAL DATA:  Fall on ice today.  Evaluate distal femur fracture. EXAM: CT OF THE RIGHT KNEE WITHOUT CONTRAST TECHNIQUE: Multidetector CT imaging of the right knee was performed according to the standard protocol. Multiplanar CT image reconstructions were also generated. COMPARISON:  Radiographs same date. FINDINGS: Bones/Joint/Cartilage Again demonstrated is a comminuted, impacted and significantly displaced fracture of the distal right femoral diaphysis. This fracture demonstrates up to 3.9 cm of lateral displacement and up to 3.1 cm of impaction. There is also a lateral rotary component. There is distal intercondylar extension of the fracture in the sagittal plane, extending into the intercondylar notch. This component is only mildly  displaced but does involve the articular surface of the medial trochlea. There is no involvement of the weight-bearing articular surface of the femoral condyle. The proximal tibia, proximal fibula and patella are intact. There is a small lipohemarthrosis. No large intra-articular fracture fragments are seen. Ligaments Suboptimally evaluated by CT.  The cruciate ligaments appear intact. Muscles and Tendons The extensor mechanism is intact. No large intramuscular hematoma identified. Soft tissues There is no soft tissue emphysema or foreign body. Moderate femoral popliteal atherosclerosis noted. No evidence of acute vascular injury. IMPRESSION: 1. Comminuted and significantly displaced fracture of the distal femora as described. As discussed, this fracture demonstrates distal intercondylar extension with involvement of the medial trochlea. There is no involvement of the weight-bearing articular surface. 2. Associated lipohemarthrosis. 3. No dislocation or tibial fracture. Electronically Signed  By: Richardean Sale M.D.   On: 02/21/2016 11:15   Dg Chest Port 1 View  Result Date: 02/21/2016 CLINICAL DATA:  Preoperative evaluation, history hypertension, diabetes mellitus, prostate cancer EXAM: PORTABLE CHEST 1 VIEW COMPARISON:  Portable exam 0840 hours compared 09/04/2005 FINDINGS: Normal heart size, mediastinal contours, and pulmonary vascularity. Atherosclerotic calcification aorta. Lungs clear. No pleural effusion or pneumothorax. Bones unremarkable. IMPRESSION: No acute abnormalities. Aortic atherosclerosis. Electronically Signed   By: Lavonia Dana M.D.   On: 02/21/2016 08:57   Dg Knee Right Port  Result Date: 02/21/2016 CLINICAL DATA:  Knee injury EXAM: PORTABLE RIGHT KNEE - 1-2 VIEW COMPARISON:  Portable exam 0838 hours without priors for comparison FINDINGS: Osseous demineralization. Comminuted oblique fracture of the distal RIGHT femoral metadiaphysis with posterior and lateral displacement as well as  overriding and mild apex medial angulation. Additionally, a longitudinal fracture plane is identified within the distal femoral metaphyseal fragment extending intra-articular at the intercondylar notch. Posteriorly displaced bone fragment at the fracture site. Patella, tibia, and fibula appear intact. No additional fracture or dislocation. Associated soft tissue deformity and swelling. Atherosclerotic calcifications of distal superficial femoral and popliteal arteries extending into trifurcation vessels. IMPRESSION: Comminuted displaced oblique distal RIGHT femoral metadiaphyseal fracture with intra-articular extension of a longitudinal fracture plane into the intercondylar notch at the RIGHT knee. Electronically Signed   By: Lavonia Dana M.D.   On: 02/21/2016 08:52   Dg Femur, Min 2 Views Right  Result Date: 02/21/2016 CLINICAL DATA:  Pain following fall EXAM: RIGHT FEMUR 2 VIEWS COMPARISON:  None. FINDINGS: Frontal and lateral views were obtained. There is a comminuted fracture of the distal femoral diaphysis with posterior displacement of the distal major fracture fragment with respect to the proximal fragment. There is a approximately 5 cm of overriding of fracture fragments in this area. There is a joint effusion. No other fractures. No dislocations. There is extensive arterial vascular calcification. IMPRESSION: Comminuted displaced fracture distal femoral diaphysis. Joint effusion. No dislocation. Extensive atherosclerotic vascular calcification. Electronically Signed   By: Lowella Grip III M.D.   On: 02/21/2016 10:26    Procedures Procedures (including critical care time)  Medications Ordered in ED Medications  acetaminophen (TYLENOL) tablet 650 mg (not administered)    Or  acetaminophen (TYLENOL) suppository 650 mg (not administered)  traZODone (DESYREL) tablet 25 mg (not administered)  ondansetron (ZOFRAN) tablet 4 mg (not administered)    Or  ondansetron (ZOFRAN) injection 4 mg (not  administered)  docusate sodium (COLACE) capsule 100 mg (not administered)  insulin aspart (novoLOG) injection 0-15 Units (not administered)  insulin aspart (novoLOG) injection 0-5 Units (not administered)  hydrALAZINE (APRESOLINE) injection 5 mg (not administered)  HYDROmorphone (DILAUDID) injection 1 mg (1 mg Intravenous Given 02/21/16 1119)  dextrose 5 % and 0.45 % NaCl with KCl 20 mEq/L infusion ( Intravenous New Bag/Given 02/21/16 1417)  HYDROmorphone (DILAUDID) injection 1 mg (1 mg Intravenous Given 02/21/16 0841)  ondansetron (ZOFRAN) injection 4 mg (4 mg Intravenous Given 02/21/16 0841)  HYDROmorphone (DILAUDID) injection 1 mg (1 mg Intravenous Given 02/21/16 1418)     Initial Impression / Assessment and Plan / ED Course  I have reviewed the triage vital signs and the nursing notes.  Pertinent labs & imaging results that were available during my care of the patient were reviewed by me and considered in my medical decision making (see chart for details).     Patient with distal femoral fracture. He'll be admitted by the hospitalist. Dr.XU, who will take the  patient to the OR. Hospitalist will admit the patient. The patient's pain control.  Final Clinical Impressions(s) / ED Diagnoses   Final diagnoses:  Pre-op chest exam  Fracture    New Prescriptions New Prescriptions   No medications on file     Margarita Mail, PA-C 02/21/16 [place], MD 02/22/16 [id number]

## 2016-02-21 NOTE — ED Notes (Signed)
CBG 175. 

## 2016-02-21 NOTE — H&P (Signed)
History and Physical    Christian Estrada EVO:[id number] DOB: 12-31-1945 DOA: 02/21/2016  PCP: Kandice Hams, MD Patient coming from: home  Chief Complaint: right knee pain  HPI: Christian Estrada is a 71 y.o. male with medical history significant for DM, HTN, hyperlipidemia, gerd, severe emergency Department chief complaint right knee pain resulting from a mechanical fall. Initial evaluation reveals comminuted displaced oblique distal RIGHT femoral metadiaphyseal fracture.  Information obtained from patient and wife who is at bedside. 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 Reports immediate pain and inability to get up. He denies hitting his head losing consciousness. Denies headache, dizziness, visual disturbances syncope or near syncope. Denies chest pain, palpitations sob LE edema cough fever chills. Denies abdominal pain diarrhea constipation melena. Denies dysuria hematuria frequency or urgency. He denies fever chills recent travel or sick contacts. He reports someone observed him falling and EMS was called.   ED Course: The emergency department he's afebrile hemodynamically stable and not hypoxic. He's provided with fentanyl with fair relief  Review of Systems: As per HPI otherwise 10 point review of systems negative.   Ambulatory Status: Ambulates independently independent with ADLs  Past Medical History:  Diagnosis Date  . Allergy    pcn  . Back pain   . BPH with obstruction/lower urinary tract symptoms   . Diabetes mellitus   . Dysrhythmia   . ED (erectile dysfunction)   . GERD (gastroesophageal reflux disease)   . Hypercholesterolemia   . Hypertension   . 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 Radiation 04/07/12-06/03/12   7800 cGy,seminal vesicles 5600 cGy  . Reflux   . Shortness of breath     Past Surgical History:  Procedure  Laterality Date  . CIRCUMCISION    . ESOPHAGOGASTRODUODENOSCOPY  09/11/2011   Procedure: ESOPHAGOGASTRODUODENOSCOPY (EGD);  Surgeon: [name], MD;  Location: [facility] ENDOSCOPY;  Service: Endoscopy;  Laterality: N/A;    Social History   Social History  . Marital status: Married    Spouse name: N/A  . Number of children: N/A  . Years of education: N/A   Occupational History  . RETIRED    Social History Main Topics  . Smoking status: Never Smoker  . Smokeless tobacco: Never Used  . Alcohol use No  . Drug use: No  . Sexual activity: Not on file   Other Topics Concern  . Not on file   Social History Narrative  . No narrative on file   He lives at home with his wife. 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 Penicillins Other (See Comments)    Unknown-reaction years ago    Family History  Problem Relation Age of Onset  . Diabetes Mother   . Cancer Brother     prostate  . Cancer Brother     prostate    Prior to Admission medications   Medication Sig Start Date End Date Taking? Authorizing Provider  amlodipine-atorvastatin (CADUET) 10-20 MG per tablet Take 1 tablet by mouth daily.    Historical Provider, MD  aspirin 81 MG tablet Take 81 mg by mouth once a week.     Historical Provider, MD  ferrous fumarate (HEMOCYTE - 106 MG FE) 325 (106 FE) MG TABS Take 1 tablet by mouth.    Historical Provider,  MD  Multiple Vitamin (MULTIVITAMIN WITH MINERALS) TABS Take 1 tablet by mouth every morning.    Historical Provider, MD  Omega-3 Fatty Acids (FISH OIL PO) Take 1 capsule by mouth every morning.    Historical Provider, MD  ONGLYZA 5 MG TABS tablet daily.  03/18/12   Historical Provider, MD  Pioglitazone HCl-Metformin HCl (ACTOPLUS MET XR) 30-1000 MG TB24 Take 1 tablet by mouth daily with supper.    Historical Provider, MD  Ranitidine HCl (ZANTAC PO) Take 1 tablet by mouth daily as needed.      Historical Provider, MD  sitaGLIPtin (JANUVIA) 100 MG tablet Take 100 mg by mouth daily.    Historical Provider, MD  valsartan-hydrochlorothiazide (DIOVAN-HCT) 320-12.5 MG per tablet Take 1 tablet by mouth daily.    Historical Provider, MD  vitamin C (ASCORBIC ACID) 500 MG tablet Take 500 mg by mouth daily.    Historical Provider, MD    Physical Exam: Vitals:   02/21/16 0845 02/21/16 0900 02/21/16 0915 02/21/16 0930  BP: 120/74  142/69 143/69  Pulse: 89 92 91 89  Resp: _0 (!) 27  Temp:      TempSrc:      SpO2: 98% 96% 97% 98%  Weight:      Height:         General:  Appears calm and comfortable No acute distress Eyes:  PERRL, EOMI, normal lids, iris ENT:  grossly normal hearing, lips & tongue, because membranes of his mouth are pink and dry Neck:  no LAD, masses or thyromegaly Cardiovascular:  RRR, +murmur. No LE edema. Bilateral feet slightly cool to touch pedal pulses present and palpable Respiratory:  CTA bilaterally, no w/r/r. Normal respiratory effort. Abdomen:  soft, ntnd, is a bowel sounds throughout no guarding or rebounding Skin:  no rash or induration seen on limited exam Musculoskeletal:  Right leg externally rotated no shortening right knee varus deformity swelling no erythema or heat Psychiatric:  grossly normal mood and affect, speech fluent and appropriate, AOx3 Neurologic:  CN 2-12 grossly intact, moves all extremities in coordinated fashion, sensation intact  Labs on Admission: I have personally reviewed following labs and imaging studies  CBC:  Recent Labs Lab 02/21/16 0905  WBC 13.5*  HGB 13.7  HCT 40.9  MCV 98.3  PLT 893   Basic Metabolic Panel:  Recent Labs Lab 02/21/16 0905  NA 141  K 3.9  CL 107  CO2 26  GLUCOSE 160*  BUN 11  CREATININE 0.94  CALCIUM 9.4   GFR: Estimated Creatinine Clearance: 97.9 mL/min (by C-G formula based on SCr of 0.94 mg/dL). Liver Function Tests: No results for input(s): AST, ALT, ALKPHOS, BILITOT,  PROT, ALBUMIN in the last 168 hours. No results for input(s): LIPASE, AMYLASE in the last 168 hours. No results for input(s): AMMONIA in the last 168 hours. Coagulation Profile: No results for input(s): INR, PROTIME in the last 168 hours. Cardiac Enzymes: No results for input(s): CKTOTAL, CKMB, CKMBINDEX, TROPONINI in the last 168 hours. BNP (last 3 results) No results for input(s): PROBNP in the last 8760 hours. HbA1C: No results for input(s): HGBA1C in the last 72 hours. CBG: No results for input(s): GLUCAP in the last 168 hours. Lipid Profile: No results for input(s): CHOL, HDL, LDLCALC, TRIG, CHOLHDL, LDLDIRECT in the last 72 hours. Thyroid Function Tests: No results for input(s): TSH, T4TOTAL, FREET4, T3FREE, THYROIDAB in the last 72 hours. Anemia Panel: No results for input(s): VITAMINB12, FOLATE, FERRITIN, TIBC, IRON, RETICCTPCT in the last  72 hours. Urine analysis: No results found for: COLORURINE, APPEARANCEUR, LABSPEC, PHURINE, GLUCOSEU, HGBUR, BILIRUBINUR, KETONESUR, PROTEINUR, UROBILINOGEN, NITRITE, LEUKOCYTESUR  Creatinine Clearance: Estimated Creatinine Clearance: 97.9 mL/min (by C-G formula based on SCr of 0.94 mg/dL).  Sepsis Labs: _0 (procalcitonin:4,lacticidven:4) )No results found for this or any previous visit (from the past 240 hour(s)).   Radiological Exams on Admission: Dg Chest Port 1 View  Result Date: 02/21/2016 CLINICAL DATA:  Preoperative evaluation, history hypertension, diabetes mellitus, prostate cancer EXAM: PORTABLE CHEST 1 VIEW COMPARISON:  Portable exam 0840 hours compared 09/04/2005 FINDINGS: Normal heart size, mediastinal contours, and pulmonary vascularity. Atherosclerotic calcification aorta. Lungs clear. No pleural effusion or pneumothorax. Bones unremarkable. IMPRESSION: No acute abnormalities. Aortic atherosclerosis. Electronically Signed   By: Lavonia Dana M.D.   On: 02/21/2016 08:57   Dg Knee Right Port  Result Date:  02/21/2016 CLINICAL DATA:  Knee injury EXAM: PORTABLE RIGHT KNEE - 1-2 VIEW COMPARISON:  Portable exam 0838 hours without priors for comparison FINDINGS: Osseous demineralization. Comminuted oblique fracture of the distal RIGHT femoral metadiaphysis with posterior and lateral displacement as well as overriding and mild apex medial angulation. Additionally, a longitudinal fracture plane is identified within the distal femoral metaphyseal fragment extending intra-articular at the intercondylar notch. Posteriorly displaced bone fragment at the fracture site. Patella, tibia, and fibula appear intact. No additional fracture or dislocation. Associated soft tissue deformity and swelling. Atherosclerotic calcifications of distal superficial femoral and popliteal arteries extending into trifurcation vessels. IMPRESSION: Comminuted displaced oblique distal RIGHT femoral metadiaphyseal fracture with intra-articular extension of a longitudinal fracture plane into the intercondylar notch at the RIGHT knee. Electronically Signed   By: Lavonia Dana M.D.   On: 02/21/2016 08:52    EKG: Independently reviewed. Sinus rhythm Atrial premature complexes  Assessment/Plan Principal Problem:   Femoral fracture (HCC) Active Problems:   Anemia   Diabetes mellitus (HCC)   HTN (hypertension)   Prostate cancer (HCC)   GERD (gastroesophageal reflux disease)    #1. Femoral fracture after mechanical fall on the ice today. X-ray as noted above. Orthopedic surgery evaluated and indicated likely surgery this afternoon. Chest x-ray with no acute abnormalities. EKG sinus rhythm as noted above. -Admit -Pain management -We'll obtain PT INR urinalysis -Nothing by mouth -Antral IV fluids -Hold Lovenox for now -SCDs -Surgery this afternoon per orthopedics  #2. Diabetes. Serum glucose 160 on admission. Home medications include oral agents. -Hold home medications for now -Pain a hemoglobin A1c -Use sliding scale for optimal  control  3. Hypertension. Fair control in the emergency department. Medications include amlodipine, HCTZ and valsartan. -Hold home medications for now -When necessary hydralazine  #4. History of prostate cancer. Completed radiation 2014 reports a meal check up with urology  5. GERD. Stable at baseline. -Continue home meds  #6. Anemia. Stable on admission. History of GI bleed. EGD 2013 -Monitor   DVT prophylaxis: scd's Code Status: full  Family Communication: wife at bedside  Disposition Plan: home  Consults called: Dr Erlinda Hong ortho Admission status: inpatient    Radene Gunning MD [facility]  If 7PM-7AM, please contact night-coverage [email or web address] Password [email or web address]  02/21/2016, 10:08 AM

## 2016-02-21 NOTE — ED Triage Notes (Signed)
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 R knee edematous, splinted on arrival. Per EMS, pt landed on back, did not hit head, no LOC. +PMS, +pulses in RLE. 150 mcg Fentanyl given by EMS.

## 2016-02-22 ENCOUNTER — Encounter (HOSPITAL_COMMUNITY)
Admission: EM | Disposition: A | Discharge status: Skilled Nursing Facility | Payer: Self-pay | Source: Home / Self Care | Attending: Internal Medicine

## 2016-02-22 ENCOUNTER — Inpatient Hospital Stay (HOSPITAL_COMMUNITY): Payer: Medicare Other | Admitting: Certified Registered Nurse Anesthetist

## 2016-02-22 ENCOUNTER — Inpatient Hospital Stay (HOSPITAL_COMMUNITY): Payer: Medicare Other

## 2016-02-22 ENCOUNTER — Encounter (HOSPITAL_COMMUNITY): Payer: Self-pay | Admitting: Anesthesiology

## 2016-02-22 HISTORY — PX: ORIF FEMUR FRACTURE: SHX2119

## 2016-02-22 LAB — BASIC METABOLIC PANEL
ANION GAP: 10 (ref 5–15)
BUN: 11 mg/dL (ref 6–20)
CALCIUM: 8.9 mg/dL (ref 8.9–10.3)
CO2: 26 mmol/L (ref 22–32)
Chloride: 103 mmol/L (ref 101–111)
Creatinine, Ser: 0.92 mg/dL (ref 0.61–1.24)
GLUCOSE: 158 mg/dL — AB (ref 65–99)
Potassium: 3.6 mmol/L (ref 3.5–5.1)
SODIUM: 139 mmol/L (ref 135–145)

## 2016-02-22 LAB — CBC
HCT: 36.1 % — ABNORMAL LOW (ref 39.0–52.0)
HEMOGLOBIN: 11.6 g/dL — AB (ref 13.0–17.0)
MCH: 31.6 pg (ref 26.0–34.0)
MCHC: 32.1 g/dL (ref 30.0–36.0)
MCV: 98.4 fL (ref 78.0–100.0)
Platelets: 152 10*3/uL (ref 150–400)
RBC: 3.67 MIL/uL — AB (ref 4.22–5.81)
RDW: 13.7 % (ref 11.5–15.5)
WBC: 11.1 10*3/uL — AB (ref 4.0–10.5)

## 2016-02-22 LAB — GLUCOSE, CAPILLARY
GLUCOSE-CAPILLARY: 136 mg/dL — AB (ref 65–99)
Glucose-Capillary: 178 mg/dL — ABNORMAL HIGH (ref 65–99)
Glucose-Capillary: 188 mg/dL — ABNORMAL HIGH (ref 65–99)

## 2016-02-22 LAB — SURGICAL PCR SCREEN
MRSA, PCR: NEGATIVE
STAPHYLOCOCCUS AUREUS: NEGATIVE

## 2016-02-22 SURGERY — OPEN REDUCTION INTERNAL FIXATION (ORIF) DISTAL FEMUR FRACTURE
Anesthesia: General | Site: Leg Upper | Laterality: Right

## 2016-02-22 MED ORDER — HYDROMORPHONE HCL 1 MG/ML IJ SOLN
0.2500 mg | INTRAMUSCULAR | Status: DC | PRN
  Administered 2016-02-22 (×3): 0.5 mg via INTRAVENOUS

## 2016-02-22 MED ORDER — MENTHOL 3 MG MT LOZG: 1.0000 | LOZENGE | OROMUCOSAL | Status: DC | PRN

## 2016-02-22 MED ORDER — OXYCODONE HCL 5 MG PO TABS
5.0000 mg | ORAL_TABLET | ORAL | Status: DC | PRN
  Administered 2016-02-24 – 2016-02-25 (×3): 10 mg via ORAL
  Administered 2016-02-25: 5 mg via ORAL
  Administered 2016-02-25: 10 mg via ORAL
  Administered 2016-02-26: 5 mg via ORAL
  Filled 2016-02-22 (×2): qty 2
  Filled 2016-02-22: qty 1
  Filled 2016-02-22 (×2): qty 2
  Filled 2016-02-22: qty 1

## 2016-02-22 MED ORDER — ACETAMINOPHEN 325 MG PO TABS
650.0000 mg | ORAL_TABLET | Freq: Four times a day (QID) | ORAL | Status: DC | PRN
  Administered 2016-02-25: 650 mg via ORAL
  Filled 2016-02-22: qty 2

## 2016-02-22 MED ORDER — HYDROCODONE-ACETAMINOPHEN 5-325 MG PO TABS
1.0000 | ORAL_TABLET | Freq: Four times a day (QID) | ORAL | Status: DC | PRN
  Administered 2016-02-22 – 2016-02-26 (×8): 2 via ORAL
  Filled 2016-02-22 (×8): qty 2

## 2016-02-22 MED ORDER — FENTANYL CITRATE (PF) 100 MCG/2ML IJ SOLN
INTRAMUSCULAR | Status: AC
  Filled 2016-02-22: qty 2

## 2016-02-22 MED ORDER — ONDANSETRON HCL 4 MG/2ML IJ SOLN
INTRAMUSCULAR | Status: DC | PRN
  Administered 2016-02-22: 4 mg via INTRAVENOUS

## 2016-02-22 MED ORDER — 0.9 % SODIUM CHLORIDE (POUR BTL) OPTIME
TOPICAL | Status: DC | PRN
Start: 2016-02-22 — End: 2016-02-22
  Administered 2016-02-22: 1000 mL

## 2016-02-22 MED ORDER — SODIUM CHLORIDE 0.9 % IV SOLN
INTRAVENOUS | Status: DC
  Administered 2016-02-22: 17:00:00 via INTRAVENOUS

## 2016-02-22 MED ORDER — METHOCARBAMOL 500 MG PO TABS
500.0000 mg | ORAL_TABLET | Freq: Four times a day (QID) | ORAL | Status: DC | PRN
  Administered 2016-02-22 – 2016-02-26 (×6): 500 mg via ORAL
  Filled 2016-02-22 (×6): qty 1

## 2016-02-22 MED ORDER — VANCOMYCIN HCL IN DEXTROSE 1-5 GM/200ML-% IV SOLN
1000.0000 mg | Freq: Two times a day (BID) | INTRAVENOUS | Status: DC
  Filled 2016-02-22: qty 200

## 2016-02-22 MED ORDER — HYDROMORPHONE HCL 1 MG/ML IJ SOLN
INTRAMUSCULAR | Status: AC
  Filled 2016-02-22: qty 0.5

## 2016-02-22 MED ORDER — SUGAMMADEX SODIUM 200 MG/2ML IV SOLN
INTRAVENOUS | Status: DC | PRN
  Administered 2016-02-22: 200 mg via INTRAVENOUS

## 2016-02-22 MED ORDER — METOCLOPRAMIDE HCL 5 MG/ML IJ SOLN: 5.0000 mg | Freq: Three times a day (TID) | INTRAMUSCULAR | Status: DC | PRN

## 2016-02-22 MED ORDER — CEFAZOLIN SODIUM-DEXTROSE 2-4 GM/100ML-% IV SOLN: 2.0000 g | Freq: Four times a day (QID) | INTRAVENOUS | Status: DC

## 2016-02-22 MED ORDER — METHOCARBAMOL 1000 MG/10ML IJ SOLN
500.0000 mg | Freq: Four times a day (QID) | INTRAVENOUS | Status: DC | PRN
  Filled 2016-02-22: qty 5

## 2016-02-22 MED ORDER — ACETAMINOPHEN 650 MG RE SUPP: 650.0000 mg | Freq: Four times a day (QID) | RECTAL | Status: DC | PRN

## 2016-02-22 MED ORDER — HYDROGEN PEROXIDE 3 % EX SOLN
CUTANEOUS | Status: DC | PRN
  Administered 2016-02-22: 1

## 2016-02-22 MED ORDER — SODIUM CHLORIDE 0.9 % IR SOLN
Status: DC | PRN
  Administered 2016-02-22: 6000 mL

## 2016-02-22 MED ORDER — MEPERIDINE HCL 25 MG/ML IJ SOLN: 6.2500 mg | INTRAMUSCULAR | Status: DC | PRN

## 2016-02-22 MED ORDER — ENOXAPARIN SODIUM 40 MG/0.4ML ~~LOC~~ SOLN: 40.0000 mg | Freq: Every day | SUBCUTANEOUS | 0 refills | Status: DC

## 2016-02-22 MED ORDER — VANCOMYCIN HCL IN DEXTROSE 1-5 GM/200ML-% IV SOLN
1000.0000 mg | Freq: Two times a day (BID) | INTRAVENOUS | Status: AC
  Administered 2016-02-22 – 2016-02-23 (×2): 1000 mg via INTRAVENOUS
  Filled 2016-02-22 (×2): qty 200

## 2016-02-22 MED ORDER — METOCLOPRAMIDE HCL 5 MG PO TABS: 5.0000 mg | ORAL_TABLET | Freq: Three times a day (TID) | ORAL | Status: DC | PRN

## 2016-02-22 MED ORDER — LACTATED RINGERS IV SOLN
INTRAVENOUS | Status: DC
  Administered 2016-02-22 (×2): via INTRAVENOUS

## 2016-02-22 MED ORDER — ONDANSETRON HCL 4 MG/2ML IJ SOLN: 4.0000 mg | Freq: Four times a day (QID) | INTRAMUSCULAR | Status: DC | PRN

## 2016-02-22 MED ORDER — MIDAZOLAM HCL 2 MG/2ML IJ SOLN
INTRAMUSCULAR | Status: AC
  Filled 2016-02-22: qty 2

## 2016-02-22 MED ORDER — PROPOFOL 10 MG/ML IV BOLUS
INTRAVENOUS | Status: AC
Start: 2016-02-22 — End: 2016-02-22
  Filled 2016-02-22: qty 20

## 2016-02-22 MED ORDER — FENTANYL CITRATE (PF) 100 MCG/2ML IJ SOLN
INTRAMUSCULAR | Status: DC | PRN
  Administered 2016-02-22: 50 ug via INTRAVENOUS
  Administered 2016-02-22: 100 ug via INTRAVENOUS
  Administered 2016-02-22: 50 ug via INTRAVENOUS

## 2016-02-22 MED ORDER — ROCURONIUM BROMIDE 100 MG/10ML IV SOLN
INTRAVENOUS | Status: DC | PRN
  Administered 2016-02-22 (×5): 10 mg via INTRAVENOUS
  Administered 2016-02-22: 50 mg via INTRAVENOUS
  Administered 2016-02-22 (×2): 10 mg via INTRAVENOUS

## 2016-02-22 MED ORDER — LACTATED RINGERS IV SOLN: INTRAVENOUS | Status: DC

## 2016-02-22 MED ORDER — ONDANSETRON HCL 4 MG PO TABS: 4.0000 mg | ORAL_TABLET | Freq: Four times a day (QID) | ORAL | Status: DC | PRN

## 2016-02-22 MED ORDER — ENOXAPARIN SODIUM 40 MG/0.4ML ~~LOC~~ SOLN
40.0000 mg | SUBCUTANEOUS | Status: DC
  Administered 2016-02-23 – 2016-02-26 (×4): 40 mg via SUBCUTANEOUS
  Filled 2016-02-22 (×4): qty 0.4

## 2016-02-22 MED ORDER — OXYCODONE-ACETAMINOPHEN 5-325 MG PO TABS: 1.0000 | ORAL_TABLET | ORAL | 0 refills | Status: DC | PRN

## 2016-02-22 MED ORDER — PROPOFOL 10 MG/ML IV BOLUS
INTRAVENOUS | Status: DC | PRN
  Administered 2016-02-22: 140 mg via INTRAVENOUS

## 2016-02-22 MED ORDER — PROMETHAZINE HCL 25 MG/ML IJ SOLN: 6.2500 mg | INTRAMUSCULAR | Status: DC | PRN

## 2016-02-22 MED ORDER — PHENOL 1.4 % MT LIQD: 1.0000 | OROMUCOSAL | Status: DC | PRN

## 2016-02-22 MED ORDER — ALUM & MAG HYDROXIDE-SIMETH 200-200-20 MG/5ML PO SUSP: 30.0000 mL | ORAL | Status: DC | PRN

## 2016-02-22 MED ORDER — BUPIVACAINE HCL (PF) 0.25 % IJ SOLN
INTRAMUSCULAR | Status: AC
  Filled 2016-02-22: qty 30

## 2016-02-22 MED ORDER — HYDROMORPHONE HCL 1 MG/ML IJ SOLN
INTRAMUSCULAR | Status: AC
  Administered 2016-02-22: 0.5 mg via INTRAVENOUS
  Filled 2016-02-22: qty 0.5

## 2016-02-22 MED ORDER — ROCURONIUM BROMIDE 50 MG/5ML IV SOSY
PREFILLED_SYRINGE | INTRAVENOUS | Status: AC
  Filled 2016-02-22: qty 5

## 2016-02-22 MED ORDER — LIDOCAINE HCL (CARDIAC) 20 MG/ML IV SOLN
INTRAVENOUS | Status: DC | PRN
  Administered 2016-02-22: 60 mg via INTRAVENOUS

## 2016-02-22 MED ORDER — MORPHINE SULFATE (PF) 2 MG/ML IV SOLN: 0.5000 mg | INTRAVENOUS | Status: DC | PRN

## 2016-02-22 SURGICAL SUPPLY — 62 items
BANDAGE ELASTIC 6 VELCRO ST LF (GAUZE/BANDAGES/DRESSINGS) ×2 IMPLANT
BAR EXFX 600X11 NS LF (EXFIX) ×2
BAR GLASS FIBER EXFX 11X600 (EXFIX) ×4 IMPLANT
BIT DRILL 4.3 (BIT) ×5 IMPLANT
BIT DRILL 4.3X300MM (BIT) ×2 IMPLANT
BIT DRILL CANN MED FLUTE 4.0 (BIT) ×1 IMPLANT
BLADE SURG ROTATE 9660 (MISCELLANEOUS) IMPLANT
BNDG COHESIVE 3X5 TAN STRL LF (GAUZE/BANDAGES/DRESSINGS) ×2 IMPLANT
CAP LOCK NCB (Cap) ×16 IMPLANT
CLAMP PIN 2 BAR 45MM EXFIX (EXFIX) ×2 IMPLANT
DRAPE C-ARM 42X72 X-RAY (DRAPES) ×2 IMPLANT
DRAPE C-ARMOR (DRAPES) ×2 IMPLANT
DRAPE IMP U-DRAPE 54X76 (DRAPES) ×4 IMPLANT
DRAPE INCISE IOBAN 66X45 STRL (DRAPES) ×2 IMPLANT
DRAPE ORTHO SPLIT 77X108 STRL (DRAPES) ×4
DRAPE SURG ORHT 6 SPLT 77X108 (DRAPES) ×4 IMPLANT
DRAPE U-SHAPE 47X51 STRL (DRAPES) ×2 IMPLANT
DRILL BIT 4.3 (BIT) ×1
DRILL CANN 4.0MM (BIT) ×2
DRSG MEPILEX BORDER 4X4 (GAUZE/BANDAGES/DRESSINGS) ×2 IMPLANT
DRSG MEPILEX BORDER 4X8 (GAUZE/BANDAGES/DRESSINGS) ×2 IMPLANT
ELECT REM PT RETURN 9FT ADLT (ELECTROSURGICAL) ×2
ELECTRODE REM PT RTRN 9FT ADLT (ELECTROSURGICAL) ×1 IMPLANT
GAUZE XEROFORM 5X9 LF (GAUZE/BANDAGES/DRESSINGS) ×2 IMPLANT
GLOVE SKINSENSE NS SZ7.5 (GLOVE) ×2
GLOVE SKINSENSE STRL SZ7.5 (GLOVE) ×2 IMPLANT
GOWN STRL REIN XL XLG (GOWN DISPOSABLE) ×4 IMPLANT
K-WIRE 2.0 (WIRE) ×2
K-WIRE FXSTD 280X2XNS SS (WIRE) ×1
KIT BASIN OR (CUSTOM PROCEDURE TRAY) ×2 IMPLANT
KIT ROOM TURNOVER OR (KITS) ×2 IMPLANT
KWIRE FXSTD 280X2XNS SS (WIRE) ×1 IMPLANT
MANIFOLD NEPTUNE II (INSTRUMENTS) ×2 IMPLANT
NS IRRIG 1000ML POUR BTL (IV SOLUTION) ×2 IMPLANT
PACK GENERAL/GYN (CUSTOM PROCEDURE TRAY) ×2 IMPLANT
PACK UNIVERSAL I (CUSTOM PROCEDURE TRAY) IMPLANT
PAD ARMBOARD 7.5X6 YLW CONV (MISCELLANEOUS) ×4 IMPLANT
PIN CLAMP 2BAR 75MM BLUE (EXFIX) ×2 IMPLANT
PIN HALF ORANGE 5X200X45MM (EXFIX) ×4 IMPLANT
PIN HALF YELLOW 5X160X35 (EXFIX) ×4 IMPLANT
PLATE RT DISTAL FEMUR NCB 18H (Plate) ×2 IMPLANT
SCREW 5.0 70MM (Screw) ×2 IMPLANT
SCREW CORT NCB SELFTAP 5.0X50 (Screw) ×2 IMPLANT
SCREW CORTICAL 5.0X95MM (Screw) ×2 IMPLANT
SCREW CORTICAL NCB 5.0X40 (Screw) ×4 IMPLANT
SCREW CORTICAL NCB 5.0X44 (Screw) ×2 IMPLANT
SCREW CORTICAL NCB 5.0X90MM (Screw) ×2 IMPLANT
SCREW NCB 3.5X75X5X6.2XST (Screw) ×2 IMPLANT
SCREW NCB 5.0X36MM (Screw) ×2 IMPLANT
SCREW NCB 5.0X38 (Screw) ×2 IMPLANT
SCREW NCB 5.0X75MM (Screw) ×4 IMPLANT
SCREW NCB 5.0X85MM (Screw) ×6 IMPLANT
SET IRRIG Y TYPE TUR BLADDER L (SET/KITS/TRAYS/PACK) ×2 IMPLANT
SPONGE LAP 18X18 X RAY DECT (DISPOSABLE) ×2 IMPLANT
STAPLER VISISTAT 35W (STAPLE) ×6 IMPLANT
SUT ETHILON 2 0 FS 18 (SUTURE) IMPLANT
SUT VIC AB 0 CT1 27 (SUTURE) ×4
SUT VIC AB 0 CT1 27XBRD ANBCTR (SUTURE) ×2 IMPLANT
SUT VIC AB 2-0 CT1 36 (SUTURE) ×6 IMPLANT
TOWEL OR 17X24 6PK STRL BLUE (TOWEL DISPOSABLE) ×2 IMPLANT
TOWEL OR 17X26 10 PK STRL BLUE (TOWEL DISPOSABLE) ×2 IMPLANT
WATER STERILE IRR 1000ML POUR (IV SOLUTION) ×2 IMPLANT

## 2016-02-22 NOTE — Anesthesia Preprocedure Evaluation (Addendum)
Anesthesia Evaluation  Patient identified by MRN, date of birth, ID band Patient awake    Reviewed: Allergy & Precautions, NPO status , Patient's Chart, lab work & pertinent test results  Airway Mallampati: II  TM Distance: >3 FB Neck ROM: Full    Dental  (+) Teeth Intact, Dental Advisory Given   Pulmonary neg pulmonary ROS,    breath sounds clear to auscultation       Cardiovascular hypertension, Pt. on medications + dysrhythmias  Rhythm:Regular Rate:Normal     Neuro/Psych negative neurological ROS  negative psych ROS   GI/Hepatic Neg liver ROS, GERD  ,  Endo/Other  diabetes, Type 2, Oral Hypoglycemic Agents  Renal/GU negative Renal ROS  negative genitourinary   Musculoskeletal negative musculoskeletal ROS (+)   Abdominal   Peds negative pediatric ROS (+)  Hematology negative hematology ROS (+)   Anesthesia Other Findings - HLD  Reproductive/Obstetrics negative OB ROS                            Lab Results  Component Value Date   WBC 11.1 (H) 02/22/2016   HGB 11.6 (L) 02/22/2016   HCT 36.1 (L) 02/22/2016   MCV 98.4 02/22/2016   PLT 152 02/22/2016   Lab Results  Component Value Date   CREATININE 0.92 02/22/2016   BUN 11 02/22/2016   NA 139 02/22/2016   K 3.6 02/22/2016   CL 103 02/22/2016   CO2 26 02/22/2016   Lab Results  Component Value Date   INR 0.96 02/21/2016   INR 1.04 09/10/2011   02/2016 EKG: normal sinus rhythm.  05/2015 Echo - Left ventricle: The cavity size was normal. Systolic function was   normal. The estimated ejection fraction was in the range of 55%   to 60%. Wall motion was normal; there were no regional wall   motion abnormalities. - Aortic valve: There was very mild stenosis. There was trivial   regurgitation. Peak velocity (S): 232 cm/s. Mean gradient (S): 10   mm Hg. - Mitral valve: Calcified annulus. There was mild regurgitation. - Left atrium:  The atrium was mildly dilated. - Right ventricle: The cavity size was moderately dilated. Wall   thickness was normal.  Anesthesia Physical Anesthesia Plan  ASA: II  Anesthesia Plan: General   Post-op Pain Management:    Induction: Intravenous  Airway Management Planned: Oral ETT  Additional Equipment:   Intra-op Plan:   Post-operative Plan: Extubation in OR  Informed Consent: I have reviewed the patients History and Physical, chart, labs and discussed the procedure including the risks, benefits and alternatives for the proposed anesthesia with the patient or authorized representative who has indicated his/her understanding and acceptance.   Dental advisory given  Plan Discussed with: CRNA  Anesthesia Plan Comments:         Anesthesia Quick Evaluation

## 2016-02-22 NOTE — Discharge Instructions (Signed)
° ° °  1. Change dressings as needed °2. May shower but keep incisions covered and dry °3. Take lovenox to prevent blood clots °4. Take stool softeners as needed °5. Take pain meds as needed ° °

## 2016-02-22 NOTE — Progress Notes (Signed)
Orthopedic Tech Progress Note Patient Details:  [name] Feb 10, 1945 [id number] Called [employer] for brace Patient ID: Christian Estrada, male   DOB: 1945-09-07, 71 y.o.   MRN: [id number]   [name] 02/22/2016, 3:15 PM

## 2016-02-22 NOTE — Transfer of Care (Signed)
Immediate Anesthesia Transfer of Care Note  Patient: Christian Estrada  Procedure(s) Performed: Procedure(s): OPEN REDUCTION INTERNAL FIXATION (ORIF) DISTAL FEMUR FRACTURE (Right)  Patient Location: PACU  Anesthesia Type:General  Level of Consciousness: awake, alert , oriented and patient cooperative  Airway & Oxygen Therapy: Patient Spontanous Breathing and Patient connected to face mask oxygen  Post-op Assessment: Report given to RN and Post -op Vital signs reviewed and stable  Post vital signs: Reviewed and stable  Last Vitals:  Vitals:   02/22/16 0400 02/22/16 1200  BP: (!) 141/66 (!) 149/71  Pulse: (!) 104 88  Resp: 18 16  Temp: 37 C 36.4 C    Last Pain:  Vitals:   02/22/16 0607  TempSrc:   PainSc: 5       Patients Stated Pain Goal: 2 ([id number])  Complications: No apparent anesthesia complications

## 2016-02-22 NOTE — Anesthesia Postprocedure Evaluation (Addendum)
Anesthesia Post Note  Patient: GUINN WEISHAUPT  Procedure(s) Performed: Procedure(s) (LRB): OPEN REDUCTION INTERNAL FIXATION (ORIF) DISTAL FEMUR FRACTURE (Right)  Patient location during evaluation: PACU Anesthesia Type: General Level of consciousness: awake and alert Pain management: pain level controlled Vital Signs Assessment: post-procedure vital signs reviewed and stable Respiratory status: spontaneous breathing, nonlabored ventilation, respiratory function stable and patient connected to nasal cannula oxygen Cardiovascular status: blood pressure returned to baseline and stable Postop Assessment: no signs of nausea or vomiting Anesthetic complications: no       Last Vitals:  Vitals:   02/22/16 1341 02/22/16 1342  BP:  (!) 146/71  Pulse: 95 92  Resp: 12 14  Temp: 36.7 C     Last Pain:  Vitals:   02/22/16 1330  TempSrc:   PainSc: Asleep                 Effie Berkshire

## 2016-02-22 NOTE — Progress Notes (Signed)
Orthopedic Tech Progress Note Patient Details:  Christian Estrada [birth year] [id number] Brace completed by [employer]. Patient ID: Christian Estrada, male   DOB: 07/02/1945, 72 y.o.   MRN: [id number]   [name] 02/22/2016, 4:22 PM

## 2016-02-22 NOTE — Anesthesia Procedure Notes (Signed)
Procedure Name: Intubation Date/Time: 02/22/2016 8:06 AM Performed by: Shirlyn Goltz Pre-anesthesia Checklist: Patient identified, Emergency Drugs available, Suction available and Patient being monitored Patient Re-evaluated:Patient Re-evaluated prior to inductionOxygen Delivery Method: Circle system utilized Preoxygenation: Pre-oxygenation with 100% oxygen Intubation Type: IV induction Ventilation: Mask ventilation without difficulty Laryngoscope Size: Mac and 4 Grade View: Grade III Tube type: Oral Tube size: 7.0 mm Number of attempts: 1 Airway Equipment and Method: Stylet Placement Confirmation: ETT inserted through vocal cords under direct vision,  positive ETCO2 and breath sounds checked- equal and bilateral Secured at: 22 cm Tube secured with: Tape Dental Injury: Teeth and Oropharynx as per pre-operative assessment

## 2016-02-22 NOTE — Progress Notes (Signed)
[facility] Progress Note  Patient: Christian Estrada [id number]   PCP: Kandice Hams, MD DOB: 25-Oct-1945   DOA: 02/21/2016   DOS: 02/22/2016   Date of Service: the patient was seen and examined on 02/22/2016  Brief hospital course: Pt. with PMH of HTN, DM, prostate cancer, GERD; admitted on 02/21/2016, with complaint of fall, was found to have fracture right. Patient underwent surgical correction. Currently further plan is postoperative course and PTOT recommendation.  Assessment and Plan: 1. Femur fracture, right (HCC) SPO2 is 02/22/2016. Patient tolerated procedure well. Postprocedure patient is on room air. Pain well controlled. Will monitor.  2. Hypertension. Blood pressure well controlled. We'll monitor. Resume home medication tomorrow.  3. Type 2 diabetes mellitus. Currently on sliding scale insulin.  Bowel regimen: last BM prior to admission Diet: Cardiac and carb modified diet DVT Prophylaxis: subcutaneous Heparin  Advance goals of care discussion: Full code  Family Communication: no family was present at bedside, at the time of interview.  Disposition:  Discharge to likely SNF versus home with home health. Expected discharge date: 02/23/2016, postoperative course  Consultants: Orthopedic Procedures: ORIF of right femur fracture  Antibiotics: Anti-infectives    Start     Dose/Rate Route Frequency Ordered Stop   02/22/16 2000  vancomycin (VANCOCIN) IVPB 1000 mg/200 mL premix     1,000 mg 200 mL/hr over 60 Minutes Intravenous Every 12 hours 02/22/16 1501 02/23/16 1959   02/22/16 1500  vancomycin (VANCOCIN) IVPB 1000 mg/200 mL premix  Status:  Discontinued     1,000 mg 200 mL/hr over 60 Minutes Intravenous Every 12 hours 02/22/16 1459 02/22/16 1500   02/22/16 1415  ceFAZolin (ANCEF) IVPB 2g/100 mL premix  Status:  Discontinued     2 g 200 mL/hr over 30 Minutes Intravenous Every 6 hours 02/22/16 1404 02/22/16 1459   02/22/16 0715  vancomycin (VANCOCIN)  1,500 mg in sodium chloride 0.9 % 500 mL IVPB     1,500 mg 250 mL/hr over 120 Minutes Intravenous On call to O.R. 02/21/16 2046 02/22/16 0900   02/22/16 0600  ceFAZolin (ANCEF) 3 g in dextrose 5 % 50 mL IVPB  Status:  Discontinued     3 g 130 mL/hr over 30 Minutes Intravenous On call to O.R. 02/21/16 2046 02/21/16 2106        Subjective: Feeling better, no acute complaints.  Objective: Physical Exam: Vitals:   02/22/16 1418 02/22/16 1433 02/22/16 1445 02/22/16 1501  BP: (!) 153/54 (!) 150/54 (!) 152/58 (!) 148/55  Pulse: 68 70 66 68  Resp: 15 16 16 15   Temp: 98.1 F (36.7 C) 98.2 F (36.8 C) 98.2 F (36.8 C) 98.2 F (36.8 C)  TempSrc: Oral Oral Oral Oral  SpO2: 93% 95% 94% 94%  Weight:      Height:        Intake/Output Summary (Last 24 hours) at 02/22/16 1814 Last data filed at 02/22/16 1135  Gross per 24 hour  Intake             1000 ml  Output              300 ml  Net              700 ml   Filed Weights   02/21/16 0743 02/21/16 2042  Weight: 120.2 kg (265 lb) 96.3 kg (212 lb 4.9 oz)    General: Alert, Awake and Oriented to Time, Place and Person. Appear in mild distress, affect appropriate Eyes: PERRL, Conjunctiva normal ENT:  Oral Mucosa clear moist. Neck: no JVD, no Abnormal Mass Or lumps Cardiovascular: S1 and S2 Present, no Murmur, Respiratory: Bilateral Air entry equal and Decreased, no use of accessory muscle, Clear to Auscultation, no Crackles, no wheezes Abdomen: Bowel Sound persent, Soft and no tenderness Skin: no redness, no Rash, no induration Extremities: no Pedal edema, no calf tenderness Neurologic: Grossly no focal neuro deficit. Bilaterally Equal motor strength  Data Reviewed: CBC:  Recent Labs Lab 02/21/16 0905 02/22/16 0351  WBC 13.5* 11.1*  HGB 13.7 11.6*  HCT 40.9 36.1*  MCV 98.3 98.4  PLT 167 0000000   Basic Metabolic Panel:  Recent Labs Lab 02/21/16 0905 02/22/16 0351  NA 141 139  K 3.9 3.6  CL 107 103  CO2 26 26  GLUCOSE  160* 158*  BUN 11 11  CREATININE 0.94 0.92  CALCIUM 9.4 8.9    Liver Function Tests: No results for input(s): AST, ALT, ALKPHOS, BILITOT, PROT, ALBUMIN in the last 168 hours. No results for input(s): LIPASE, AMYLASE in the last 168 hours. No results for input(s): AMMONIA in the last 168 hours. Coagulation Profile:  Recent Labs Lab 02/21/16 1331  INR 0.96   Cardiac Enzymes: No results for input(s): CKTOTAL, CKMB, CKMBINDEX, TROPONINI in the last 168 hours. BNP (last 3 results) No results for input(s): PROBNP in the last 8760 hours.  CBG:  Recent Labs Lab 02/21/16 1207 02/21/16 1655 02/21/16 2103 02/22/16 0640 02/22/16 1236  GLUCAP 175* 216* 118* 136* 178*    Studies: Dg C-arm Gt 120 Min  Result Date: 02/22/2016 CLINICAL DATA:  ORIF right distal femur fracture EXAM: RIGHT FEMUR 2 VIEWS; DG C-ARM GT 120 MIN COMPARISON:  02/21/2016 right femur radiograph FINDINGS: Fluoroscopy time 6 minutes 36 seconds. Multiple nondiagnostic spot fluoroscopic intraoperative radiographs of the right femur demonstrate postsurgical changes from ORIF comminuted distal right femur fracture, with transfixation of the fracture by a lateral surgical plate with multiple interlocking screws. IMPRESSION: Intraoperative fluoroscopic guidance for ORIF comminuted right distal femur fracture. Electronically Signed   By: Ilona Sorrel M.D.   On: 02/22/2016 11:51   Dg Femur, Min 2 Views Right  Result Date: 02/22/2016 CLINICAL DATA:  ORIF right distal femur fracture EXAM: RIGHT FEMUR 2 VIEWS; DG C-ARM GT 120 MIN COMPARISON:  02/21/2016 right femur radiograph FINDINGS: Fluoroscopy time 6 minutes 36 seconds. Multiple nondiagnostic spot fluoroscopic intraoperative radiographs of the right femur demonstrate postsurgical changes from ORIF comminuted distal right femur fracture, with transfixation of the fracture by a lateral surgical plate with multiple interlocking screws. IMPRESSION: Intraoperative fluoroscopic  guidance for ORIF comminuted right distal femur fracture. Electronically Signed   By: Ilona Sorrel M.D.   On: 02/22/2016 11:51   Dg Femur Port, Min 2 Views Right  Result Date: 02/22/2016 CLINICAL DATA:  Postop right femur fracture repair EXAM: RIGHT FEMUR PORTABLE 2 VIEW COMPARISON:  None. FINDINGS: Severely comminuted distal right femoral diaphysis and metaphysis fracture transfixed with a lateral collateral sideplate with multiple interlocking screws. Fracture is in near anatomic alignment. No hardware failure complication. Postsurgical changes in the soft tissues adjacent to the right femur. Peripheral vascular atherosclerotic disease. IMPRESSION: Interval ORIF right distal femoral comminuted fracture. Electronically Signed   By: Kathreen Devoid   On: 02/22/2016 14:37     Scheduled Meds: . docusate sodium  100 mg Oral BID  . [START ON 02/23/2016] enoxaparin (LOVENOX) injection  40 mg Subcutaneous Q24H  . HYDROmorphone      . HYDROmorphone      .  insulin aspart  0-15 Units Subcutaneous TID WC  . insulin aspart  0-5 Units Subcutaneous QHS  . vancomycin  1,000 mg Intravenous Q12H   Continuous Infusions: . sodium chloride 125 mL/hr at 02/22/16 1723  . dextrose 5 % and 0.45 % NaCl with KCl 20 mEq/L 50 mL/hr at 02/21/16 1417  . lactated ringers 10 mL/hr at 02/22/16 0659   PRN Meds: acetaminophen **OR** acetaminophen, alum & mag hydroxide-simeth, hydrALAZINE, HYDROcodone-acetaminophen, HYDROmorphone (DILAUDID) injection, menthol-cetylpyridinium **OR** phenol, methocarbamol **OR** methocarbamol (ROBAXIN)  IV, metoCLOPramide **OR** metoCLOPramide (REGLAN) injection, morphine injection, ondansetron **OR** ondansetron (ZOFRAN) IV, oxyCODONE, traZODone  Time spent: 30 minutes  Author: Berle Mull, MD [facility] Pager: [phone] 02/22/2016 6:14 PM  If 7PM-7AM, please contact night-coverage at [email or web address], password [facility]

## 2016-02-23 DIAGNOSIS — D62 Acute posthemorrhagic anemia: Secondary | ICD-10-CM | POA: Diagnosis not present

## 2016-02-23 LAB — CBC
HCT: 27.5 % — ABNORMAL LOW (ref 39.0–52.0)
Hemoglobin: 9.2 g/dL — ABNORMAL LOW (ref 13.0–17.0)
MCH: 32.4 pg (ref 26.0–34.0)
MCHC: 33.5 g/dL (ref 30.0–36.0)
MCV: 96.8 fL (ref 78.0–100.0)
PLATELETS: 122 10*3/uL — AB (ref 150–400)
RBC: 2.84 MIL/uL — AB (ref 4.22–5.81)
RDW: 13.6 % (ref 11.5–15.5)
WBC: 11.2 10*3/uL — ABNORMAL HIGH (ref 4.0–10.5)

## 2016-02-23 LAB — BASIC METABOLIC PANEL
Anion gap: 6 (ref 5–15)
BUN: 7 mg/dL (ref 6–20)
CO2: 27 mmol/L (ref 22–32)
Calcium: 8.1 mg/dL — ABNORMAL LOW (ref 8.9–10.3)
Chloride: 105 mmol/L (ref 101–111)
Creatinine, Ser: 0.88 mg/dL (ref 0.61–1.24)
GFR calc Af Amer: >60 mL/min (ref >60)
GLUCOSE: 187 mg/dL — AB (ref 65–99)
POTASSIUM: 3.6 mmol/L (ref 3.5–5.1)
Sodium: 138 mmol/L (ref 135–145)

## 2016-02-23 LAB — GLUCOSE, CAPILLARY
Glucose-Capillary: 186 mg/dL — ABNORMAL HIGH (ref 65–99)
Glucose-Capillary: 247 mg/dL — ABNORMAL HIGH (ref 65–99)
Glucose-Capillary: 166 mg/dL — ABNORMAL HIGH (ref 65–99)
Glucose-Capillary: 205 mg/dL — ABNORMAL HIGH (ref 65–99)

## 2016-02-23 LAB — HEMOGLOBIN A1C
Hgb A1c MFr Bld: 5.6 % (ref 4.8–5.6)
Mean Plasma Glucose: 114 mg/dL

## 2016-02-23 MED ORDER — AMLODIPINE BESYLATE 10 MG PO TABS
10.0000 mg | ORAL_TABLET | Freq: Every day | ORAL | Status: DC
Start: 2016-02-23 — End: 2016-02-24
  Administered 2016-02-23: 10 mg via ORAL
  Filled 2016-02-23: qty 1

## 2016-02-23 MED ORDER — ASPIRIN EC 81 MG PO TBEC
81.0000 mg | DELAYED_RELEASE_TABLET | ORAL | Status: DC
Start: 2016-02-23 — End: 2016-02-26
  Administered 2016-02-23: 81 mg via ORAL
  Filled 2016-02-23: qty 1

## 2016-02-23 MED ORDER — ATORVASTATIN CALCIUM 20 MG PO TABS
20.0000 mg | ORAL_TABLET | Freq: Every day | ORAL | Status: DC
  Administered 2016-02-23 – 2016-02-25 (×3): 20 mg via ORAL
  Filled 2016-02-23 (×3): qty 1

## 2016-02-23 MED ORDER — AMLODIPINE-ATORVASTATIN 10-20 MG PO TABS: 1.0000 | ORAL_TABLET | Freq: Every day | ORAL | Status: DC

## 2016-02-23 NOTE — Evaluation (Signed)
Physical Therapy Evaluation Patient Details Name: Christian Estrada MRN: [id number] DOB: September 06, 1945 Today's Date: 02/23/2016   History of Present Illness  Pt is a 71 yo male admitted on 02/21/16 following a fall on the ice resulting in a right hip fx. Pt underwent on ORIF on 02/22/16. PMH is significant for HTN, DM2, HLD, GERD, Prostate CA.   Clinical Impression  Pt is POD 1 following the above procedure. 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 Pt requires Mod-Max A for bed mobs and transfers this session requiring increased cues to maintain NWB through RLE. Pt's family was interested in [place], but pt is not appropriate and will more benefit from short term rehab at a SNF. Pt would like to return home, but due to his current environment with 6 steps leading into the house this would be very difficulty. Pt is agreeable to SNF for rehab at this time. Pt will benefit from continued skilled PT services to address below deficits before discharge to next venue of care.     Follow Up Recommendations SNF;Supervision/Assistance - 24 hour    Equipment Recommendations  None recommended by PT;Other (comment) (defer to next venue of care)    Recommendations for Other Services       Precautions / Restrictions Precautions Precautions: Fall Required Braces or Orthoses: Other Brace/Splint Other Brace/Splint: bledsoe brace RLE Restrictions Weight Bearing Restrictions: Yes RLE Weight Bearing: Non weight bearing      Mobility  Bed Mobility Overal bed mobility: Needs Assistance Bed Mobility: Supine to Sit     Supine to sit: Mod assist;+2 for physical assistance;HOB elevated     General bed mobility comments: +2 assistance to bring LE's EOB and to assist with trunk upright at EOB.   Transfers Overall transfer level: Needs assistance Equipment used: Rolling walker (2 wheeled) Transfers: Sit to/from [employer] Sit to Stand: Max assist;+2 physical assistance;From elevated surface Stand pivot transfers: Mod assist;+2 physical assistance;+2 safety/equipment       General transfer comment: Mod-Max A for sit to stand and stand pivot transfer from bed to [facility] and BSC to recliner with max cues to maintain NWB on RLE with mobility.   Ambulation/Gait             General Gait Details: Did not attempt this session  Stairs            Wheelchair Mobility    Modified Rankin (Stroke Patients Only)       Balance Overall balance assessment: Needs assistance Sitting-balance support: No upper extremity supported;Feet supported Sitting balance-Leahy Scale: Good Sitting balance - Comments: sitting EOB no back support   Standing balance support: Bilateral upper extremity supported Standing balance-Leahy Scale: Poor Standing balance comment: Relies on RW stability with transitional movements                             Pertinent Vitals/Pain Pain Assessment: 0-10 Pain Score: 7  Pain Location: right hip Pain Descriptors / Indicators: Aching;Grimacing;Sore Pain Intervention(s): Limited activity within patient's tolerance;Monitored during session;Patient requesting pain meds-RN notified;Repositioned    Home Living Family/patient expects to be discharged to:: Private residence Living Arrangements: Spouse/significant other;Children Available Help at Discharge: Friend(s);Family;Available 24 hours/day Type of Home: House Home Access: Stairs to enter Entrance Stairs-Rails: Psychiatric nurse of Steps: 6 Home Layout: Two level;Able to live on main level with bedroom/bathroom Home Equipment: Hand held shower  head Additional Comments: Pt want to return home. Family is agreeable to Rehab before home    Prior Function Level of Independence: Independent         Comments: completely indepedent prior to admission. Was delivering newspapers at night and driving      Hand Dominance   Dominant Hand: Right    Extremity/Trunk Assessment   Upper Extremity Assessment Upper Extremity Assessment: Defer to OT evaluation    Lower Extremity Assessment Lower Extremity Assessment: RLE deficits/detail RLE Deficits / Details: Pt with normal post op pain and weakness. At least 3/5 ankle and 2/5 knee and hip per gross functional assessment RLE: Unable to fully assess due to pain;Unable to fully assess due to immobilization       Communication   Communication: No difficulties  Cognition Arousal/Alertness: Awake/alert Behavior During Therapy: WFL for tasks assessed/performed Overall Cognitive Status: Within Functional Limits for tasks assessed                      General Comments General comments (skin integrity, edema, etc.): Pt wants to return home with his wife and daughter. Wife and daughter feel rehab is his best option due to barriers to getting into home.     Exercises     Assessment/Plan    PT Assessment Patient needs continued PT services  PT Problem List Decreased strength;Decreased activity tolerance;Decreased balance;Decreased range of motion;Decreased mobility;Decreased knowledge of use of DME;Decreased knowledge of precautions;Pain          PT Treatment Interventions DME instruction;Gait training;Functional mobility training;Therapeutic activities;Therapeutic exercise;Balance training;Patient/family education    PT Goals (Current goals can be found in the Care Plan section)  Acute Rehab PT Goals Patient Stated Goal: to go home and get back to walking PT Goal Formulation: With patient Time For Goal Achievement: 03/01/16 Potential to Achieve Goals: Good    Frequency Min 3X/week   Barriers to discharge Inaccessible home environment Pt has 6 steps to get into home which will be difficult to negotiate with NWB RLE    Co-evaluation PT/OT/SLP Co-Evaluation/Treatment: Yes Reason for Co-Treatment: For patient/therapist  safety;To address functional/ADL transfers PT goals addressed during session: Mobility/safety with mobility         End of Session Equipment Utilized During Treatment: Gait belt;Other (comment) (bledsoe brace RLE) Activity Tolerance: Patient limited by pain Patient left: in chair;with call bell/phone within reach;with family/visitor present Nurse Communication: Mobility status;Patient requests pain meds         Time: [id number] PT Time Calculation (min) (ACUTE ONLY): 35 min   Charges:   PT Evaluation $PT Eval Moderate Complexity: 1 Procedure     PT G Codes:        Scheryl Marten PT, DPT  [phone]  02/23/2016, 4:44 PM

## 2016-02-23 NOTE — Progress Notes (Signed)
Spoke w/ ortho tech about overhead bed trapeze for pt use. None available at this time but pt was placed on list to receive.

## 2016-02-23 NOTE — Evaluation (Signed)
Occupational Therapy Evaluation Patient Details Name: Christian Estrada MRN: [id number] DOB: 13-Jan-1946 Today's Date: 02/23/2016    History of Present Illness Pt is a 71 yo male admitted on 02/21/16 following a fall on the ice resulting in a right hip fx. Pt underwent on ORIF on 02/22/16. PMH is significant for HTN, DM2, HLD, GERD, Prostate CA.    Clinical Impression   PTA, pt was independent with ADL and functional mobility and was working delivering papers. Pt currently requires mod-max assist +2 with bed mobility in preparation for ADL. He required mod assist +2 for stand-pivot toilet transfer to [facility] and max assist with LB ADL at this time. Pt demonstrates significant functional decline and would benefit from short-term SNF placement for continued rehabilitation in order to maximize PLOF prior to D/C home with assistance from his wife. Family interested in [place] for continued rehab; however feel that pt is not appropriate candidate for CIR at this time and family is agreeable to short term stay at SNF. Pt would benefit from continued skilled OT services while admitted in order to improve ADL independence and address functional deficits detailed below.  Follow Up Recommendations  SNF;Supervision/Assistance - 24 hour    Equipment Recommendations  Other (comment) (TBD at next venue of care)    Recommendations for Other Services       Precautions / Restrictions Precautions Precautions: Fall Required Braces or Orthoses: Other Brace/Splint Other Brace/Splint: bledsoe brace RLE Restrictions Weight Bearing Restrictions: Yes RLE Weight Bearing: Non weight bearing      Mobility Bed Mobility Overal bed mobility: Needs Assistance Bed Mobility: Supine to Sit     Supine to sit: Mod assist;+2 for physical assistance;HOB elevated     General bed mobility comments: +2 assistance to bring LE's EOB and to assist with trunk upright at EOB.   Transfers Overall transfer level: Needs  assistance Equipment used: Rolling walker (2 wheeled) Transfers: Sit to/from [facility] Sit to Stand: Max assist;+2 physical assistance;From elevated surface Stand pivot transfers: Mod assist;+2 physical assistance;+2 safety/equipment       General transfer comment: Mod-Max A for sit to stand and stand pivot transfer from bed to [facility] and BSC to recliner with max cues to maintain NWB on RLE with mobility.     Balance Overall balance assessment: Needs assistance Sitting-balance support: No upper extremity supported;Feet supported Sitting balance-Leahy Scale: Good Sitting balance - Comments: sitting EOB no back support   Standing balance support: Bilateral upper extremity supported Standing balance-Leahy Scale: Poor Standing balance comment: Relies on RW stability with transitional movements                            ADL Overall ADL's : Needs assistance/impaired Eating/Feeding: Set up;Sitting   Grooming: Set up;Sitting   Upper Body Bathing: Set up;Sitting   Lower Body Bathing: Maximal assistance;Sit to/from stand;+2 for physical assistance   Upper Body Dressing : Set up;Sitting   Lower Body Dressing: Maximal assistance;Sit to/from stand;+2 for physical assistance   Toilet Transfer: Moderate assistance;+2 for safety/equipment;Stand-pivot;BSC;RW;+2 for physical assistance Toilet Transfer Details (indicate cue type and reason): VC's for hand placement and safe use of RW. Max +2 assist for sit<>stand transfer. Toileting- Clothing Manipulation and Hygiene: Moderate assistance;Sit to/from stand Toileting - Clothing Manipulation Details (indicate cue type and reason): Mod assist for sit<>stand and to maintain balance while performing pericare.     Functional mobility during ADLs: Moderate assistance;+2 for safety/equipment;Rolling walker General ADL Comments:  Educated pt and family on safe stand-pivot toilet transfer along with safe use of 3-in-1 BSC and  RW. Pt educated on fall prevention while admitted as well as benefits of rehabilitation post-acute D/C.     Vision Vision Assessment?: No apparent visual deficits   Perception     Praxis      Pertinent Vitals/Pain Pain Assessment: 0-10 Pain Score: 7  Pain Location: right hip Pain Descriptors / Indicators: Aching;Grimacing;Sore Pain Intervention(s): Limited activity within patient's tolerance;Monitored during session;Patient requesting pain meds-RN notified;Repositioned     Hand Dominance Right   Extremity/Trunk Assessment Upper Extremity Assessment Upper Extremity Assessment: Overall WFL for tasks assessed   Lower Extremity Assessment Lower Extremity Assessment: Defer to PT evaluation RLE Deficits / Details: Pt with normal post op pain and weakness. At least 3/5 ankle and 2/5 knee and hip per gross functional assessment RLE: Unable to fully assess due to pain;Unable to fully assess due to immobilization       Communication Communication Communication: No difficulties   Cognition Arousal/Alertness: Awake/alert Behavior During Therapy: WFL for tasks assessed/performed Overall Cognitive Status: Within Functional Limits for tasks assessed                     General Comments       Exercises       Shoulder Instructions      Home Living Family/patient expects to be discharged to:: Private residence Living Arrangements: Spouse/significant other;Children Available Help at Discharge: Friend(s);Family;Available 24 hours/day Type of Home: House Home Access: Stairs to enter [employer] of Steps: 6 (Steep first step) Entrance Stairs-Rails: Right;Left Home Layout: Two level;Able to live on main level with bedroom/bathroom Alternate Level Stairs-Number of Steps: na   Bathroom Shower/Tub: Tub/shower unit Shower/tub characteristics: Architectural technologist: Handicapped height     Home Equipment: Hand held shower head   Additional Comments: Pt want to  return home. Family is agreeable to Rehab before home      Prior Functioning/Environment Level of Independence: Independent        Comments: completely indepedent prior to admission. Was delivering newspapers at night and driving        OT Problem List: Decreased strength;Decreased range of motion;Decreased activity tolerance;Impaired balance (sitting and/or standing);Decreased safety awareness;Decreased knowledge of use of DME or AE;Decreased knowledge of precautions;Pain   OT Treatment/Interventions: Self-care/ADL training;Therapeutic exercise;Energy conservation;DME and/or AE instruction;Therapeutic activities;Patient/family education;Balance training    OT Goals(Current goals can be found in the care plan section) Acute Rehab OT Goals Patient Stated Goal: to go home and get back to walking OT Goal Formulation: With patient/family Time For Goal Achievement: 03/01/16 Potential to Achieve Goals: Good ADL Goals Pt Will Perform Lower Body Bathing: with supervision;with adaptive equipment;sit to/from stand Pt Will Perform Lower Body Dressing: with supervision;with adaptive equipment;sit to/from stand Pt Will Transfer to Toilet: ambulating;bedside commode;with supervision (BSC over toilet; with RW) Pt Will Perform Toileting - Clothing Manipulation and hygiene: with supervision;sit to/from stand Pt Will Perform Tub/Shower Transfer: Tub transfer;3 in 1;ambulating;rolling walker Additional ADL Goal #1: Pt will identify and implement 3 fall prevention strategies to improve independence and safety with ADL post-acute D/C.  OT Frequency: Min 2X/week   Barriers to D/C:            Co-evaluation PT/OT/SLP Co-Evaluation/Treatment: Yes Reason for Co-Treatment: For patient/therapist safety;To address functional/ADL transfers PT goals addressed during session: Mobility/safety with mobility OT goals addressed during session: ADL's and self-care      End of Session Equipment Utilized  During  Treatment: Gait belt;Rolling walker Nurse Communication: Mobility status  Activity Tolerance: Patient tolerated treatment well Patient left: in chair;with call bell/phone within reach;with family/visitor present   Time: [id number] OT Time Calculation (min): 35 min Charges:  OT General Charges $OT Visit: 1 Procedure OT Evaluation $OT Eval Moderate Complexity: 1 Procedure G-CodesNorman Herrlich, OTR/L [id number] 02/23/2016, 5:22 PM

## 2016-02-23 NOTE — Progress Notes (Signed)
[facility] Progress Note  Patient: Christian Estrada [id number]   PCP: Kandice Hams, MD DOB: August 16, 1945   DOA: 02/21/2016   DOS: 02/23/2016   Date of Service: the patient was seen and examined on 02/23/2016  Brief hospital course: Pt. with PMH of HTN, DM, prostate cancer, GERD; admitted on 02/21/2016, with complaint of fall, was found to have fracture right. Patient underwent surgical correction. Currently further plan is postoperative course and PTOT recommendation.  Assessment and Plan: 1. Right femur fracture. S/P ORIF 02/22/2016. Patient tolerated procedure well. Postprocedure patient is on room air. Pain well controlled. Will monitor. Expected post-op blood loss.  2. Hypertension. Blood pressure well controlled. Resume amlodipine.  Resume aspirin.  3. Type 2 diabetes mellitus. Currently on sliding scale insulin.  4. Dyslipidemia Continue lipitor  5. Post-op blood loss anemia Monitor. Recheck tomorrow, no evidence of hematoma.   Bowel regimen: last BM 02/21/2016 Diet: Cardiac and carb modified diet DVT Prophylaxis: subcutaneous Heparin  Advance goals of care discussion: Full code  Family Communication: no family was present at bedside, at the time of interview.  Disposition:  Discharge to likely SNF versus home with home health. Expected discharge date: 02/24/2016, postoperative therapy Recommendation  Consultants: Orthopedic Procedures: ORIF of right femur fracture  Antibiotics: Anti-infectives    Start     Dose/Rate Route Frequency Ordered Stop   02/22/16 2000  vancomycin (VANCOCIN) IVPB 1000 mg/200 mL premix     1,000 mg 200 mL/hr over 60 Minutes Intravenous Every 12 hours 02/22/16 1501 02/23/16 1022   02/22/16 1500  vancomycin (VANCOCIN) IVPB 1000 mg/200 mL premix  Status:  Discontinued     1,000 mg 200 mL/hr over 60 Minutes Intravenous Every 12 hours 02/22/16 1459 02/22/16 1500   02/22/16 1415  ceFAZolin (ANCEF) IVPB 2g/100 mL premix  Status:   Discontinued     2 g 200 mL/hr over 30 Minutes Intravenous Every 6 hours 02/22/16 1404 02/22/16 1459   02/22/16 0715  vancomycin (VANCOCIN) 1,500 mg in sodium chloride 0.9 % 500 mL IVPB     1,500 mg 250 mL/hr over 120 Minutes Intravenous On call to O.R. 02/21/16 2046 02/22/16 0900   02/22/16 0600  ceFAZolin (ANCEF) 3 g in dextrose 5 % 50 mL IVPB  Status:  Discontinued     3 g 130 mL/hr over 30 Minutes Intravenous On call to O.R. 02/21/16 2046 02/21/16 2106      Subjective: Feeling better, pain well controlled.   Objective: Physical Exam: Vitals:   02/23/16 0248 02/23/16 0630 02/23/16 1119 02/23/16 1354  BP: 123/69 (!) 118/49 132/76 (!) 120/46  Pulse: (!) 107 (!) 106 (!) 112 76  Resp: 16 18 17 19   Temp: 99.4 F (37.4 C) 98.1 F (36.7 C) 99.1 F (37.3 C) 98.6 F (37 C)  TempSrc: Oral Oral  Oral  SpO2: 96% 97% 96% 99%  Weight:      Height:        Intake/Output Summary (Last 24 hours) at 02/23/16 1433 Last data filed at 02/23/16 0647  Gross per 24 hour  Intake          1957.08 ml  Output              300 ml  Net          1657.08 ml   Filed Weights   02/21/16 0743 02/21/16 2042  Weight: 120.2 kg (265 lb) 96.3 kg (212 lb 4.9 oz)    General: Alert, Awake and Oriented to Time, Place  and Person. Appear in mild distress, affect appropriate Eyes: PERRL, Conjunctiva normal ENT: Oral Mucosa clear moist. Neck: no JVD, no Abnormal Mass Or lumps Cardiovascular: S1 and S2 Present, no Murmur, Respiratory: Bilateral Air entry equal and Decreased, no use of accessory muscle, Clear to Auscultation, no Crackles, no wheezes Abdomen: Bowel Sound persent, Soft and no tenderness Skin: no redness, no Rash, no induration Extremities: no Pedal edema, no calf tenderness Neurologic: Grossly no focal neuro deficit. Bilaterally Equal motor strength  Data Reviewed: CBC:  Recent Labs Lab 02/21/16 0905 02/22/16 0351 02/23/16 0550  WBC 13.5* 11.1* 11.2*  HGB 13.7 11.6* 9.2*  HCT 40.9 36.1*  27.5*  MCV 98.3 98.4 96.8  PLT 167 152 [id number]*   Basic Metabolic Panel:  Recent Labs Lab 02/21/16 0905 02/22/16 0351 02/23/16 0550  NA 141 139 138  K 3.9 3.6 3.6  CL 107 103 105  CO2 26 26 27   GLUCOSE 160* 158* 187*  BUN 11 11 7   CREATININE 0.94 0.92 0.88  CALCIUM 9.4 8.9 8.1*    Liver Function Tests: No results for input(s): AST, ALT, ALKPHOS, BILITOT, PROT, ALBUMIN in the last 168 hours. No results for input(s): LIPASE, AMYLASE in the last 168 hours. No results for input(s): AMMONIA in the last 168 hours. Coagulation Profile:  Recent Labs Lab 02/21/16 1331  INR 0.96   Cardiac Enzymes: No results for input(s): CKTOTAL, CKMB, CKMBINDEX, TROPONINI in the last 168 hours. BNP (last 3 results) No results for input(s): PROBNP in the last 8760 hours.  CBG:  Recent Labs Lab 02/22/16 0640 02/22/16 1236 02/22/16 2224 02/23/16 0630 02/23/16 1123  GLUCAP 136* 178* 188* 186* 247*    Studies: No results found.   Scheduled Meds: . docusate sodium  100 mg Oral BID  . enoxaparin (LOVENOX) injection  40 mg Subcutaneous Q24H  . insulin aspart  0-15 Units Subcutaneous TID WC  . insulin aspart  0-5 Units Subcutaneous QHS   Continuous Infusions: . sodium chloride 125 mL/hr at 02/22/16 1723  . dextrose 5 % and 0.45 % NaCl with KCl 20 mEq/L 50 mL/hr at 02/21/16 1417  . lactated ringers 10 mL/hr at 02/22/16 0659   PRN Meds: acetaminophen **OR** acetaminophen, alum & mag hydroxide-simeth, hydrALAZINE, HYDROcodone-acetaminophen, HYDROmorphone (DILAUDID) injection, menthol-cetylpyridinium **OR** phenol, methocarbamol **OR** methocarbamol (ROBAXIN)  IV, metoCLOPramide **OR** metoCLOPramide (REGLAN) injection, morphine injection, ondansetron **OR** ondansetron (ZOFRAN) IV, oxyCODONE, traZODone  Time spent: 30 minutes  Author: Berle Mull, MD [facility] Pager: [phone] 02/23/2016 2:33 PM  If 7PM-7AM, please contact night-coverage at [email or web address], password [facility]

## 2016-02-24 LAB — CBC
HCT: 23.5 % — ABNORMAL LOW (ref 39.0–52.0)
HCT: 25.4 % — ABNORMAL LOW (ref 39.0–52.0)
Hemoglobin: 7.8 g/dL — ABNORMAL LOW (ref 13.0–17.0)
Hemoglobin: 8.3 g/dL — ABNORMAL LOW (ref 13.0–17.0)
MCH: 31.8 pg (ref 26.0–34.0)
MCH: 31.6 pg (ref 26.0–34.0)
MCHC: 33.2 g/dL (ref 30.0–36.0)
MCHC: 32.7 g/dL (ref 30.0–36.0)
MCV: 95.9 fL (ref 78.0–100.0)
MCV: 96.6 fL (ref 78.0–100.0)
Platelets: 122 10*3/uL — ABNORMAL LOW (ref 150–400)
Platelets: 124 10*3/uL — ABNORMAL LOW (ref 150–400)
RBC: 2.45 MIL/uL — ABNORMAL LOW (ref 4.22–5.81)
RBC: 2.63 MIL/uL — ABNORMAL LOW (ref 4.22–5.81)
RDW: 13.3 % (ref 11.5–15.5)
RDW: 13.3 % (ref 11.5–15.5)
WBC: 11.9 10*3/uL — AB (ref 4.0–10.5)
WBC: 12.4 10*3/uL — ABNORMAL HIGH (ref 4.0–10.5)

## 2016-02-24 LAB — BASIC METABOLIC PANEL
ANION GAP: 5 (ref 5–15)
BUN: 5 mg/dL — AB (ref 6–20)
CALCIUM: 7.9 mg/dL — AB (ref 8.9–10.3)
CO2: 26 mmol/L (ref 22–32)
CREATININE: 0.88 mg/dL (ref 0.61–1.24)
Chloride: 106 mmol/L (ref 101–111)
GFR calc Af Amer: >60 mL/min (ref >60)
GLUCOSE: 153 mg/dL — AB (ref 65–99)
Potassium: 3.3 mmol/L — ABNORMAL LOW (ref 3.5–5.1)
Sodium: 137 mmol/L (ref 135–145)

## 2016-02-24 LAB — GLUCOSE, CAPILLARY
GLUCOSE-CAPILLARY: 193 mg/dL — AB (ref 65–99)
GLUCOSE-CAPILLARY: 241 mg/dL — AB (ref 65–99)
GLUCOSE-CAPILLARY: 226 mg/dL — AB (ref 65–99)
Glucose-Capillary: 151 mg/dL — ABNORMAL HIGH (ref 65–99)

## 2016-02-24 MED ORDER — CYANOCOBALAMIN 1000 MCG/ML IJ SOLN
1000.0000 ug | Freq: Once | INTRAMUSCULAR | Status: AC
  Administered 2016-02-24: 1000 ug via SUBCUTANEOUS
  Filled 2016-02-24 (×2): qty 1

## 2016-02-24 MED ORDER — FOLIC ACID 1 MG PO TABS
1.0000 mg | ORAL_TABLET | Freq: Every day | ORAL | Status: DC
  Administered 2016-02-24 – 2016-02-26 (×3): 1 mg via ORAL
  Filled 2016-02-24 (×3): qty 1

## 2016-02-24 MED ORDER — SENNOSIDES-DOCUSATE SODIUM 8.6-50 MG PO TABS
1.0000 | ORAL_TABLET | Freq: Two times a day (BID) | ORAL | Status: DC
  Administered 2016-02-24 – 2016-02-26 (×5): 1 via ORAL
  Filled 2016-02-24 (×6): qty 1

## 2016-02-24 MED ORDER — PANTOPRAZOLE SODIUM 40 MG PO TBEC
40.0000 mg | DELAYED_RELEASE_TABLET | Freq: Two times a day (BID) | ORAL | Status: DC
  Administered 2016-02-24 – 2016-02-26 (×5): 40 mg via ORAL
  Filled 2016-02-24 (×5): qty 1

## 2016-02-24 MED ORDER — HYDRALAZINE HCL 25 MG PO TABS
25.0000 mg | ORAL_TABLET | Freq: Three times a day (TID) | ORAL | Status: DC
  Administered 2016-02-24 – 2016-02-26 (×7): 25 mg via ORAL
  Filled 2016-02-24 (×7): qty 1

## 2016-02-24 MED ORDER — POLYETHYLENE GLYCOL 3350 17 G PO PACK
17.0000 g | PACK | Freq: Every day | ORAL | Status: DC
  Administered 2016-02-24 – 2016-02-26 (×3): 17 g via ORAL
  Filled 2016-02-24 (×3): qty 1

## 2016-02-24 MED ORDER — FERROUS SULFATE 325 (65 FE) MG PO TABS
325.0000 mg | ORAL_TABLET | Freq: Three times a day (TID) | ORAL | Status: DC
  Administered 2016-02-24 – 2016-02-26 (×7): 325 mg via ORAL
  Filled 2016-02-24 (×7): qty 1

## 2016-02-24 MED ORDER — POTASSIUM CHLORIDE CRYS ER 20 MEQ PO TBCR
40.0000 meq | EXTENDED_RELEASE_TABLET | Freq: Once | ORAL | Status: AC
  Administered 2016-02-24: 40 meq via ORAL
  Filled 2016-02-24: qty 2

## 2016-02-24 NOTE — Progress Notes (Signed)
   Subjective:  Patient reports pain as mild.  No events  Objective:   VITALS:   Vitals:   02/23/16 2212 02/24/16 0628 02/24/16 1500 02/24/16 2100  BP: (!) 132/40 (!) 135/44 (!) 144/50 (!) 138/44  Pulse: (!) 45 (!) 38 81 88  Resp: 16 16 16    Temp: 99.1 F (37.3 C) 99.9 F (37.7 C) 98.9 F (37.2 C)   TempSrc: Oral Oral Oral   SpO2: 96% 96% 98%   Weight:      Height:        Neurologically intact Neurovascular intact Sensation intact distally Intact pulses distally Dorsiflexion/Plantar flexion intact Incision: dressing C/D/I and no drainage No cellulitis present Compartment soft   Lab Results  Component Value Date   WBC 12.4 (H) 02/24/2016   HGB 8.3 (L) 02/24/2016   HCT 25.4 (L) 02/24/2016   MCV 96.6 02/24/2016   PLT 124 (L) 02/24/2016     Assessment/Plan:  2 Days Post-Op   - Expected postop acute blood loss anemia - will monitor for symptoms - Up with PT/OT - DVT ppx - SCDs, ambulation, lovenox - NWB operative extremity, unlocked bledsoe at all times - Pain control - Discharge planning  Eduard Roux 02/24/2016, 10:26 PM [phone]

## 2016-02-24 NOTE — Op Note (Signed)
   Date of Surgery: 02/24/2016  INDICATIONS: Christian Estrada is a 71 y.o.-year-old male with a right supracondylar femur fracture;  The patient did consent to the procedure after discussion of the risks and benefits.  PREOPERATIVE DIAGNOSIS: Right subcondylar femur fracture with intracondylar extension  POSTOPERATIVE DIAGNOSIS: Same.  PROCEDURE: Open reduction internal fixation of right supracondylar femur fracture with internal condylar extension with placement of spanning knee external fixator  SURGEON: N. Eduard Roux, M.D.  ASSIST: None.  ANESTHESIA:  general  IV FLUIDS AND URINE: See anesthesia.  ESTIMATED BLOOD LOSS: 200 mL.  IMPLANTS: Zimmer  DRAINS: None  COMPLICATIONS: None.  DESCRIPTION OF PROCEDURE: The patient was brought to the operating room and placed supine on the operating table.  The patient had been signed prior to the procedure and this was documented. The patient had the anesthesia placed by the anesthesiologist.  A time-out was performed to confirm that this was the correct patient, site, side and location. The patient did receive antibiotics prior to the incision and was re-dosed during the procedure as needed at indicated intervals.  A tourniquet was not placed.  The patient had the operative extremity prepped and draped in the standard surgical fashion.   I first placed a spanning knee external fixator using fluoroscopic guidance by placing 2 Schanz pins in the femoral shaft and 2 more in the tibial shaft. The external fixator was then assembled and was used to restore relative alignment and length of the fracture. The external fixator was then tightened down. I then turned my attention to plating the fracture. A lateral incision over the distal fibula was created. Dissection was carried down to the IT band. IT band was sharply incised in line with the incision. We then created a submuscular pocket with a Cobb for placement of the periarticular locking plate. The  appropriate length plate was then slid up the lateral aspect of the femur. A K wire was advanced through the distal cluster of the plate is parallel to the joint as possible. A counter incision was then made proximally over the proximal portion of the plate in order to affix the plate to the femoral shaft. I then placed 1 nonlocking screw through the distal portion of the plate into the femoral condyle in order to bring the femoral condyle down to the plate. I then attempted to percutaneously reduce the femoral shaft to the plate but was unable to do this percutaneously therefore I had to connect to 2 incisions into one long incision. Dissection was then carried down to the IT band and this was sharply incised. I then elevated the vastus lateralis off of the lateral aspect of the femur. I then obtained a reduction with large bone clamps in order to bring the bone to the plate. Nonlocking screws were placed in the femoral shaft each with excellent purchase. I then placed a series of locking screws into the distal segment of the locking plate into the femoral condyles. Fluoroscopy was used to confirm appropriate placement of the hardware. Final x-rays were taken. The external fixator was then removed. All wounds were then thoroughly irrigated and closed in layer fashion using 0 Vicryl, 2-0 Vicryl, staples. Leg was placed in a locked Bledsoe brace. Patient tolerated procedure well and had no immediate complications.  POSTOPERATIVE PLAN: Patient will be nonweightbearing for 6 weeks and will require DVT prophylaxis.  Christian Cecil, MD [place] 10:17 PM

## 2016-02-24 NOTE — Progress Notes (Signed)
[facility] Progress Note  Patient: Christian Estrada [id number]   PCP: Kandice Hams, MD DOB: 1945-11-17   DOA: 02/21/2016   DOS: 02/24/2016   Date of Service: the patient was seen and examined on 02/24/2016  Brief hospital course: Pt. with PMH of HTN, DM, prostate cancer, GERD; admitted on 02/21/2016, with complaint of fall, was found to have fracture right. Patient underwent surgical correction. Currently further plan is postoperative course and PTOT recommendation.  Assessment and Plan: 1. Right femur fracture. S/P ORIF 02/22/2016. Patient tolerated procedure well. Postprocedure patient is on room air. Pain well controlled. Will monitor.  2. Hypertension. Blood pressure well controlled. Resume amlodipine.  Resume aspirin.  3. Type 2 diabetes mellitus. Currently on sliding scale insulin.  4. Dyslipidemia Continue lipitor  5. Post-op blood loss anemia Macrocytosis Hemoglobin dropped to 7.8 this morning, recheck 8.3. Recheck tomorrow, no evidence of hematoma.  Ordering on iron, folic acid, one dose of B-12.  Bowel regimen: last BM 02/21/2016 Diet: Cardiac and carb modified diet DVT Prophylaxis: subcutaneous Heparin  Advance goals of care discussion: Full code  Family Communication: no family was present at bedside, at the time of interview.  Disposition:  Discharge to likely SNF versus home with home health. Expected discharge date: 02/25/2016, stabilization of H&H  Consultants: Orthopedic Procedures: ORIF of right femur fracture  Antibiotics: Anti-infectives    Start     Dose/Rate Route Frequency Ordered Stop   02/22/16 2000  vancomycin (VANCOCIN) IVPB 1000 mg/200 mL premix     1,000 mg 200 mL/hr over 60 Minutes Intravenous Every 12 hours 02/22/16 1501 02/23/16 1022   02/22/16 1500  vancomycin (VANCOCIN) IVPB 1000 mg/200 mL premix  Status:  Discontinued     1,000 mg 200 mL/hr over 60 Minutes Intravenous Every 12 hours 02/22/16 1459 02/22/16 1500   02/22/16 1415  ceFAZolin (ANCEF) IVPB 2g/100 mL premix  Status:  Discontinued     2 g 200 mL/hr over 30 Minutes Intravenous Every 6 hours 02/22/16 1404 02/22/16 1459   02/22/16 0715  vancomycin (VANCOCIN) 1,500 mg in sodium chloride 0.9 % 500 mL IVPB     1,500 mg 250 mL/hr over 120 Minutes Intravenous On call to O.R. 02/21/16 2046 02/22/16 0900   02/22/16 0600  ceFAZolin (ANCEF) 3 g in dextrose 5 % 50 mL IVPB  Status:  Discontinued     3 g 130 mL/hr over 30 Minutes Intravenous On call to O.R. 02/21/16 2046 02/21/16 2106      Subjective: 2 episodes of night sweats last night. No chest pain or shortness of breath no fever no chills in the morning. No nausea no vomiting or diarrhea.  Objective: Physical Exam: Vitals:   02/23/16 1354 02/23/16 2212 02/24/16 0628 02/24/16 1500  BP: (!) 120/46 (!) 132/40 (!) 135/44 (!) 144/50  Pulse: 76 (!) 45 (!) 38 81  Resp: 19 16 16 16   Temp: 98.6 F (37 C) 99.1 F (37.3 C) 99.9 F (37.7 C) 98.9 F (37.2 C)  TempSrc: Oral Oral Oral Oral  SpO2: 99% 96% 96% 98%  Weight:      Height:        Intake/Output Summary (Last 24 hours) at 02/24/16 1723 Last data filed at 02/24/16 1500  Gross per 24 hour  Intake              720 ml  Output              400 ml  Net  320 ml   Filed Weights   02/21/16 0743 02/21/16 2042  Weight: 120.2 kg (265 lb) 96.3 kg (212 lb 4.9 oz)    General: Alert, Awake and Oriented to Time, Place and Person. Appear in mild distress, affect appropriate Eyes: PERRL, Conjunctiva normal ENT: Oral Mucosa clear moist. Neck: no JVD, no Abnormal Mass Or lumps Cardiovascular: S1 and S2 Present, no Murmur, Respiratory: Bilateral Air entry equal and Decreased, no use of accessory muscle, Clear to Auscultation, no Crackles, no wheezes Abdomen: Bowel Sound persent, Soft and no tenderness Skin: no redness, no Rash, no induration Extremities: no Pedal edema, no calf tenderness Neurologic: Grossly no focal neuro deficit.  Bilaterally Equal motor strength  Data Reviewed: CBC:  Recent Labs Lab 02/21/16 0905 02/22/16 0351 02/23/16 0550 02/24/16 0536 02/24/16 1117  WBC 13.5* 11.1* 11.2* 11.9* 12.4*  HGB 13.7 11.6* 9.2* 7.8* 8.3*  HCT 40.9 36.1* 27.5* 23.5* 25.4*  MCV 98.3 98.4 96.8 95.9 96.6  PLT 167 152 122* 122* [id number]*   Basic Metabolic Panel:  Recent Labs Lab 02/21/16 0905 02/22/16 0351 02/23/16 0550 02/24/16 0536  NA 141 139 138 137  K 3.9 3.6 3.6 3.3*  CL 107 103 105 106  CO2 26 26 27 26   GLUCOSE 160* 158* 187* 153*  BUN 11 11 7  5*  CREATININE 0.94 0.92 0.88 0.88  CALCIUM 9.4 8.9 8.1* 7.9*    Liver Function Tests: No results for input(s): AST, ALT, ALKPHOS, BILITOT, PROT, ALBUMIN in the last 168 hours. No results for input(s): LIPASE, AMYLASE in the last 168 hours. No results for input(s): AMMONIA in the last 168 hours. Coagulation Profile:  Recent Labs Lab 02/21/16 1331  INR 0.96   Cardiac Enzymes: No results for input(s): CKTOTAL, CKMB, CKMBINDEX, TROPONINI in the last 168 hours. BNP (last 3 results) No results for input(s): PROBNP in the last 8760 hours.  CBG:  Recent Labs Lab 02/23/16 1640 02/23/16 2211 02/24/16 0625 02/24/16 1153 02/24/16 1659  GLUCAP 166* 205* 151* 193* 241*    Studies: No results found.   Scheduled Meds: . aspirin EC  81 mg Oral Weekly  . atorvastatin  20 mg Oral q1800  . enoxaparin (LOVENOX) injection  40 mg Subcutaneous Q24H  . ferrous sulfate  325 mg Oral TID WC  . folic acid  1 mg Oral Daily  . hydrALAZINE  25 mg Oral Q8H  . insulin aspart  0-15 Units Subcutaneous TID WC  . insulin aspart  0-5 Units Subcutaneous QHS  . pantoprazole  40 mg Oral BID AC  . polyethylene glycol  17 g Oral Daily  . senna-docusate  1 tablet Oral BID   Continuous Infusions:  PRN Meds: acetaminophen **OR** acetaminophen, alum & mag hydroxide-simeth, hydrALAZINE, HYDROcodone-acetaminophen, HYDROmorphone (DILAUDID) injection, menthol-cetylpyridinium  **OR** phenol, methocarbamol **OR** methocarbamol (ROBAXIN)  IV, metoCLOPramide **OR** metoCLOPramide (REGLAN) injection, morphine injection, ondansetron **OR** ondansetron (ZOFRAN) IV, oxyCODONE, traZODone  Time spent: 30 minutes  Author: Berle Mull, MD [facility] Pager: [phone] 02/24/2016 5:23 PM  If 7PM-7AM, please contact night-coverage at [email or web address], password [facility]

## 2016-02-24 NOTE — Progress Notes (Signed)
Physical Therapy Treatment Patient Details Name: Christian Estrada MRN: [id number] DOB: Nov 06, 1945 Today's Date: 02/24/2016    History of Present Illness Pt is a 71 yo male admitted on 02/21/16 following a fall on the ice resulting in a right supracondylar femur fx. Pt underwent on ORIF on 02/22/16. PMH is significant for HTN, DM2, HLD, GERD, Prostate CA.     PT Comments    Pt progressing with mobility, able to move to EOB with bed rail and min A +2. Still having difficulty keeping RLE NWB with transfers and ambulation. He is going to ask family to bring a L shoe which will assist with this in part. Able to hop a couple of feet to chair from bed today. PT will continue to follow.   Follow Up Recommendations  SNF;Supervision/Assistance - 24 hour     Equipment Recommendations  Rolling walker with 5" wheels    Recommendations for Other Services       Precautions / Restrictions Precautions Precautions: Fall Required Braces or Orthoses: Other Brace/Splint Other Brace/Splint: bledsoe brace RLE Restrictions Weight Bearing Restrictions: Yes RLE Weight Bearing: Non weight bearing    Mobility  Bed Mobility Overal bed mobility: Needs Assistance Bed Mobility: Supine to Sit     Supine to sit: Min assist;+2 for physical assistance     General bed mobility comments: pt with heavy use of rail for supine to sit but able to perform with less physical assistance. Min A at RLE and to support once at EOB and min A from behind at hips.  Transfers Overall transfer level: Needs assistance Equipment used: Rolling walker (2 wheeled) Transfers: Sit to/from Stand Sit to Stand: Mod assist;+2 physical assistance         General transfer comment: vc's for hand placement and for keeping RLE NWB. Mod A +2 for power up.   Ambulation/Gait Ambulation/Gait assistance: Min assist;+2 physical assistance Ambulation Distance (Feet): 2 Feet Assistive device: Rolling walker (2 wheeled) Gait  Pattern/deviations: Step-to pattern Gait velocity: decreased Gait velocity interpretation: <1.8 ft/sec, indicative of risk for recurrent falls General Gait Details: pt able to take small hops with RW to transfer to chair that was 2' away from bed. Frequent vc's to maintain RLE NWB.    Stairs            Wheelchair Mobility    Modified Rankin (Stroke Patients Only)       Balance Overall balance assessment: Needs assistance Sitting-balance support: Bilateral upper extremity supported Sitting balance-Leahy Scale: Fair Sitting balance - Comments: heavy lean to left away from hurt leg Postural control: Left lateral lean Standing balance support: Bilateral upper extremity supported Standing balance-Leahy Scale: Poor Standing balance comment: Relies on RW stability with transitional movements                    Cognition Arousal/Alertness: Awake/alert Behavior During Therapy: WFL for tasks assessed/performed Overall Cognitive Status: Within Functional Limits for tasks assessed                      Exercises General Exercises - Lower Extremity Ankle Circles/Pumps: AROM;Both;Seated;20 reps    General Comments General comments (skin integrity, edema, etc.): asked pt to have family bring L tennis shoe to help better maintain NWB. Pt still not preferring the idea of rehab but after discussion about benefots and safety, was agreeable      Pertinent Vitals/Pain Pain Assessment: 0-10 Pain Score: 7  Pain Location: right LE Pain Descriptors / Indicators:  Aching;Grimacing;Sore Pain Intervention(s): Limited activity within patient's tolerance;Monitored during session;Premedicated before session;Repositioned    Home Living Family/patient expects to be discharged to:: Private residence Living Arrangements: Spouse/significant other;Children                  Prior Function            PT Goals (current goals can now be found in the care plan section) Acute  Rehab PT Goals Patient Stated Goal: to go home and get back to walking PT Goal Formulation: With patient Time For Goal Achievement: 03/01/16 Potential to Achieve Goals: Good Progress towards PT goals: Progressing toward goals    Frequency    Min 3X/week      PT Plan Current plan remains appropriate    Co-evaluation             End of Session Equipment Utilized During Treatment: Gait belt Activity Tolerance: Patient tolerated treatment well Patient left: in chair;with chair alarm set;with call bell/phone within reach     Time: 1103-1120 PT Time Calculation (min) (ACUTE ONLY): 17 min  Charges:  $Gait Training: 8-22 mins                    G Codes:     [place]  [place] 02/24/2016, 11:57 AM

## 2016-02-25 LAB — CBC
HCT: 25.3 % — ABNORMAL LOW (ref 39.0–52.0)
HEMATOCRIT: 21.6 % — AB (ref 39.0–52.0)
HEMATOCRIT: 22.7 % — AB (ref 39.0–52.0)
HEMOGLOBIN: 7.2 g/dL — AB (ref 13.0–17.0)
HEMOGLOBIN: 7.6 g/dL — AB (ref 13.0–17.0)
Hemoglobin: 8.4 g/dL — ABNORMAL LOW (ref 13.0–17.0)
MCH: 31.6 pg (ref 26.0–34.0)
MCH: 31.9 pg (ref 26.0–34.0)
MCH: 31.1 pg (ref 26.0–34.0)
MCHC: 33.3 g/dL (ref 30.0–36.0)
MCHC: 33.5 g/dL (ref 30.0–36.0)
MCHC: 33.2 g/dL (ref 30.0–36.0)
MCV: 94.7 fL (ref 78.0–100.0)
MCV: 95.4 fL (ref 78.0–100.0)
MCV: 93.7 fL (ref 78.0–100.0)
PLATELETS: 164 10*3/uL (ref 150–400)
Platelets: 121 10*3/uL — ABNORMAL LOW (ref 150–400)
Platelets: 137 10*3/uL — ABNORMAL LOW (ref 150–400)
RBC: 2.28 MIL/uL — ABNORMAL LOW (ref 4.22–5.81)
RBC: 2.38 MIL/uL — ABNORMAL LOW (ref 4.22–5.81)
RBC: 2.7 MIL/uL — ABNORMAL LOW (ref 4.22–5.81)
RDW: 13.4 % (ref 11.5–15.5)
RDW: 13.3 % (ref 11.5–15.5)
RDW: 13.2 % (ref 11.5–15.5)
WBC: 11.3 10*3/uL — AB (ref 4.0–10.5)
WBC: 10.3 10*3/uL (ref 4.0–10.5)
WBC: 11.6 10*3/uL — AB (ref 4.0–10.5)

## 2016-02-25 LAB — PREPARE RBC (CROSSMATCH)

## 2016-02-25 LAB — BASIC METABOLIC PANEL
ANION GAP: 5 (ref 5–15)
BUN: 7 mg/dL (ref 6–20)
CHLORIDE: 104 mmol/L (ref 101–111)
CO2: 27 mmol/L (ref 22–32)
Calcium: 7.9 mg/dL — ABNORMAL LOW (ref 8.9–10.3)
Creatinine, Ser: 0.91 mg/dL (ref 0.61–1.24)
GFR calc Af Amer: >60 mL/min (ref >60)
GFR calc non Af Amer: >60 mL/min (ref >60)
GLUCOSE: 144 mg/dL — AB (ref 65–99)
POTASSIUM: 3.3 mmol/L — AB (ref 3.5–5.1)
Sodium: 136 mmol/L (ref 135–145)

## 2016-02-25 LAB — GLUCOSE, CAPILLARY
GLUCOSE-CAPILLARY: 179 mg/dL — AB (ref 65–99)
Glucose-Capillary: 183 mg/dL — ABNORMAL HIGH (ref 65–99)
Glucose-Capillary: 211 mg/dL — ABNORMAL HIGH (ref 65–99)
Glucose-Capillary: 161 mg/dL — ABNORMAL HIGH (ref 65–99)

## 2016-02-25 MED ORDER — HYDRALAZINE HCL 25 MG PO TABS: 25.0000 mg | ORAL_TABLET | Freq: Three times a day (TID) | ORAL | 0 refills | Status: DC

## 2016-02-25 MED ORDER — POLYETHYLENE GLYCOL 3350 17 G PO PACK: 17.0000 g | PACK | Freq: Every day | ORAL | 0 refills | Status: DC

## 2016-02-25 MED ORDER — SENNOSIDES-DOCUSATE SODIUM 8.6-50 MG PO TABS: 1.0000 | ORAL_TABLET | Freq: Two times a day (BID) | ORAL | 0 refills | Status: DC

## 2016-02-25 MED ORDER — FERROUS SULFATE 325 (65 FE) MG PO TABS: 325.0000 mg | ORAL_TABLET | Freq: Three times a day (TID) | ORAL | 0 refills | Status: DC

## 2016-02-25 MED ORDER — SODIUM CHLORIDE 0.9 % IV SOLN
Freq: Once | INTRAVENOUS | Status: AC
  Administered 2016-02-25: 10 mL via INTRAVENOUS

## 2016-02-25 MED ORDER — POTASSIUM CHLORIDE CRYS ER 20 MEQ PO TBCR
40.0000 meq | EXTENDED_RELEASE_TABLET | Freq: Once | ORAL | Status: AC
  Administered 2016-02-25: 40 meq via ORAL
  Filled 2016-02-25: qty 2

## 2016-02-25 MED ORDER — FOLIC ACID 1 MG PO TABS: 1.0000 mg | ORAL_TABLET | Freq: Every day | ORAL | 0 refills | Status: DC

## 2016-02-25 MED ORDER — METHOCARBAMOL 500 MG PO TABS: 500.0000 mg | ORAL_TABLET | Freq: Four times a day (QID) | ORAL | 0 refills | Status: DC | PRN

## 2016-02-25 MED ORDER — PANTOPRAZOLE SODIUM 40 MG PO TBEC: 40.0000 mg | DELAYED_RELEASE_TABLET | Freq: Every day | ORAL | 0 refills | Status: DC

## 2016-02-25 NOTE — Clinical Social Work Note (Signed)
Clinical Social Work Assessment  Patient Details  Name: Christian Estrada MRN: [id number] Date of Birth: May 18, 1945  Date of referral:  02/25/16               Reason for consult:  Facility Placement                Permission sought to share information with:  Family Supports Permission granted to share information::  Yes, Verbal Permission Granted  Name::     [place]::     Relationship::  Spouse  Contact Information:  [phone]  Housing/Transportation Living arrangements for the past 2 months:  Single Family Home Source of Information:  Patient, Spouse Patient Interpreter Needed:  None Criminal Activity/Legal Involvement Pertinent to Current Situation/Hospitalization:  No - Comment as needed Significant Relationships:  Spouse Lives with:  Spouse Do you feel safe going back to the place where you live?  Yes Need for family participation in patient care:     Care giving concerns:  Pt's wife present at bedside during initial assessment. Pt's wife is primary caregiver for pt.   Social Worker assessment / plan:  CSW spoke with pt at bedside to complete initial assessment. Pt lives at home with spouse. Pt delivers newspapers for a living. Pt is agreeable to SNF placement at this time. Pt and pt's wife report they want the pt to go to [facility], as first choice and [facility] as second choice. Pt has delivered newspapers to [facility]. CSW will send initial referral and present b/o once available.   Employment status:  Part-Time Nurse, adult PT Recommendations:  [place] / Referral to community resources:  [place]  Patient/Family's Response to care:  Pt verbalized understanding of CSW and expressed appreciation for support. Pt denies any concern regarding care at this time.  Patient/Family's Understanding of and Emotional Response to Diagnosis, Current Treatment, and Prognosis:  Pt understanding and realistic  regarding physical limitations. Pt understanding of need for SNF at d/c. Pt is agreeable for SNF placement at this time. Pt denies any concern regarding treatment plan at this time. CSW will continue to provide support.   Emotional Assessment Appearance:  Appears stated age Attitude/Demeanor/Rapport:   (Patient was appropriate.) Affect (typically observed):  Accepting, Appropriate, Calm Orientation:  Oriented to Self, Oriented to Place, Oriented to  Time, Oriented to Situation Alcohol / Substance use:  Not Applicable Psych involvement (Current and /or in the community):  No (Comment)  Discharge Needs  Concerns to be addressed:  No discharge needs identified Readmission within the last 30 days:  No Current discharge risk:  Dependent with Mobility Barriers to Discharge:  Continued Medical Work up   [employer], LCSW 02/25/2016, 4:05 PM

## 2016-02-25 NOTE — NC FL2 (Signed)
[place] LEVEL OF CARE SCREENING TOOL     IDENTIFICATION  Patient Name: Christian Estrada Birthdate: 1945-06-28 Sex: male Admission Date (Current Location): 02/21/2016  [facility] and [place] Number:  Herbalist and Address:  [facility] [place], [place] [address], [place], [place] [address]      Provider Number: [id number]  Attending Physician Name and Address:  Lavina Hamman, MD  Relative Name and Phone Number:       Current Level of Care: Hospital Recommended Level of Care: [place] Prior Approval Number:    Date Approved/Denied: 02/25/16 PASRR Number: [id number]  Discharge Plan: SNF    Current Diagnoses: Patient Active Problem List   Diagnosis Date Noted  . Postoperative anemia due to acute blood loss 02/23/2016  . Femur fracture, right ([place]) 02/21/2016  . Reflux   . Hypertension   . Back pain   . Shortness of breath   . GERD (gastroesophageal reflux disease)   . Dysrhythmia   . Hypercholesterolemia   . BPH with obstruction/lower urinary tract symptoms   . Prostate cancer ([place]) 01/07/2012  . Anemia 09/10/2011  . Blood in stool 09/10/2011  . Diabetes mellitus ([place]) 09/10/2011  . HTN (hypertension) 09/10/2011    Orientation RESPIRATION BLADDER Height & Weight     Self, Time, Situation, Place  Normal Continent Weight: 212 lb 4.9 oz (96.3 kg) Height:  5\' 11"  (180.3 cm)  BEHAVIORAL SYMPTOMS/MOOD NEUROLOGICAL BOWEL NUTRITION STATUS      Continent  (Please see d/c summary)  AMBULATORY STATUS COMMUNICATION OF NEEDS Skin   Limited Assist (+2 assist) Verbally Surgical wounds (Closed incision right leg, compression wrap.)                       Personal Care Assistance Level of Assistance  Bathing, Feeding, Dressing Bathing Assistance: Limited assistance Feeding assistance: Independent Dressing Assistance: Limited assistance     Functional Limitations Info  Sight, Hearing, Speech Sight Info:  Adequate Hearing Info: Adequate Speech Info: Adequate    SPECIAL CARE FACTORS FREQUENCY  PT (By licensed PT), OT (By licensed OT)     PT Frequency: min 3x week OT Frequency:  min 3x week            Contractures Contractures Info: Not present    Additional Factors Info  Code Status, Allergies Code Status Info: Full Code Allergies Info: Penicillins           Current Medications (02/25/2016):  This is the current hospital active medication list Current Facility-Administered Medications  Medication Dose Route Frequency Provider Last Rate Last Dose  . 0.9 %  sodium chloride infusion   Intravenous Once Lavina Hamman, MD      . acetaminophen (TYLENOL) tablet 650 mg  650 mg Oral Q6H PRN Naiping Ephriam Jenkins, MD       Or  . acetaminophen (TYLENOL) suppository 650 mg  650 mg Rectal Q6H PRN Naiping Ephriam Jenkins, MD      . alum & mag hydroxide-simeth (MAALOX/MYLANTA) 200-200-20 MG/5ML suspension 30 mL  30 mL Oral Q4H PRN Naiping Ephriam Jenkins, MD      . aspirin EC tablet 81 mg  81 mg Oral Weekly Lavina Hamman, MD   81 mg at 02/23/16 1712  . atorvastatin (LIPITOR) tablet 20 mg  20 mg Oral q1800 Lavina Hamman, MD   20 mg at 02/24/16 1711  . enoxaparin (LOVENOX) injection 40 mg  40 mg Subcutaneous  Q24H [name], MD   40 mg at 02/25/16 0902  . ferrous sulfate tablet 325 mg  325 mg Oral TID WC Lavina Hamman, MD   325 mg at 02/25/16 1209  . folic acid (FOLVITE) tablet 1 mg  1 mg Oral Daily Lavina Hamman, MD   1 mg at 02/25/16 [id number]  . hydrALAZINE (APRESOLINE) injection 5 mg  5 mg Intravenous Q4H PRN Radene Gunning, NP      . hydrALAZINE (APRESOLINE) tablet 25 mg  25 mg Oral Q8H Lavina Hamman, MD   25 mg at 02/25/16 0559  . HYDROcodone-acetaminophen (NORCO/VICODIN) 5-325 MG per tablet 1-2 tablet  1-2 tablet Oral Q6H PRN [name], MD   2 tablet at 02/24/16 2005  . HYDROmorphone (DILAUDID) injection 1 mg  1 mg Intravenous Q3H PRN Radene Gunning, NP   1 mg at 02/22/16 0609  . insulin aspart (novoLOG)  injection 0-15 Units  0-15 Units Subcutaneous TID WC Radene Gunning, NP   5 Units at 02/25/16 1209  . insulin aspart (novoLOG) injection 0-5 Units  0-5 Units Subcutaneous QHS Radene Gunning, NP   2 Units at 02/24/16 2143  . menthol-cetylpyridinium (CEPACOL) lozenge 3 mg  1 lozenge Oral PRN Naiping Ephriam Jenkins, MD       Or  . phenol (CHLORASEPTIC) mouth spray 1 spray  1 spray Mouth/Throat PRN Naiping Ephriam Jenkins, MD      . methocarbamol (ROBAXIN) tablet 500 mg  500 mg Oral Q6H PRN [name], MD   500 mg at 02/25/16 0559   Or  . methocarbamol (ROBAXIN) 500 mg in dextrose 5 % 50 mL IVPB  500 mg Intravenous Q6H PRN Naiping Ephriam Jenkins, MD      . metoCLOPramide (REGLAN) tablet 5-10 mg  5-10 mg Oral Q8H PRN Naiping Ephriam Jenkins, MD       Or  . metoCLOPramide (REGLAN) injection 5-10 mg  5-10 mg Intravenous Q8H PRN Naiping Ephriam Jenkins, MD      . morphine 2 MG/ML injection 0.5 mg  0.5 mg Intravenous Q2H PRN Naiping Ephriam Jenkins, MD      . ondansetron (ZOFRAN) tablet 4 mg  4 mg Oral Q6H PRN Naiping Ephriam Jenkins, MD       Or  . ondansetron (ZOFRAN) injection 4 mg  4 mg Intravenous Q6H PRN Naiping Ephriam Jenkins, MD      . oxyCODONE (Oxy IR/ROXICODONE) immediate release tablet 5-10 mg  5-10 mg Oral Q4H PRN [name], MD   10 mg at 02/25/16 0559  . pantoprazole (PROTONIX) EC tablet 40 mg  40 mg Oral BID AC Lavina Hamman, MD   40 mg at 02/25/16 0903  . polyethylene glycol (MIRALAX / GLYCOLAX) packet 17 g  17 g Oral Daily Lavina Hamman, MD   17 g at 02/25/16 0903  . senna-docusate (Senokot-S) tablet 1 tablet  1 tablet Oral BID Lavina Hamman, MD   1 tablet at 02/25/16 [phone]  . traZODone (DESYREL) tablet 25 mg  25 mg Oral QHS PRN Radene Gunning, NP         Discharge Medications: Please see discharge summary for a list of discharge medications.  Relevant Imaging Results:  Relevant Lab Results:   Additional Information SSN: [id number] Ht: 5\' 11"  (1.803 m)  Wt: 212 lb 4.9 oz (96.3 kg)     Omari Koslosky A [place], LCSW

## 2016-02-25 NOTE — Progress Notes (Signed)
   Subjective:  Patient reports pain as mild.  No events  Objective:   VITALS:   Vitals:   02/24/16 0628 02/24/16 1500 02/24/16 2100 02/25/16 0530  BP: (!) 135/44 (!) 144/50 (!) 138/44 (!) 131/48  Pulse: (!) 38 81 88 (!) 110  Resp: 16 16  17   Temp: 99.9 F (37.7 C) 98.9 F (37.2 C)  99.2 F (37.3 C)  TempSrc: Oral Oral  Oral  SpO2: 96% 98%  100%  Weight:      Height:        Neurologically intact Neurovascular intact Sensation intact distally Intact pulses distally Dorsiflexion/Plantar flexion intact Incision: dressing C/D/I and no drainage No cellulitis present Compartment soft   Lab Results  Component Value Date   WBC 11.3 (H) 02/25/2016   HGB 7.2 (L) 02/25/2016   HCT 21.6 (L) 02/25/2016   MCV 94.7 02/25/2016   PLT 121 (L) 02/25/2016     Assessment/Plan:  3 Days Post-Op   - consider transfusion to keep Hgb above 8 - stable from ortho stand point  Eduard Roux 02/25/2016, 7:58 AM [phone]

## 2016-02-25 NOTE — Progress Notes (Signed)
[facility] Progress Note  Patient: Christian Estrada [id number]   PCP: Kandice Hams, MD DOB: 09-26-1945   DOA: 02/21/2016   DOS: 02/25/2016   Date of Service: the patient was seen and examined on 02/25/2016  Brief hospital course: Pt. with PMH of HTN, DM, prostate cancer, GERD; admitted on 02/21/2016, with complaint of fall, was found to have fracture right. Patient underwent surgical correction. Currently further plan is postoperative course and PTOT recommendation.  Assessment and Plan: 1. Right femur fracture. S/P ORIF 02/22/2016. Patient tolerated procedure well. Postprocedure patient is on room air. Pain well controlled. Will monitor. post-op blood loss.  2. Hypertension. Blood pressure well controlled. Resume amlodipine.  Resume aspirin.  3. Type 2 diabetes mellitus. Currently on sliding scale insulin.  4. Dyslipidemia Continue lipitor  5. Post-op blood loss anemia Hb 7.2 today. Ortho[edics recommends to transfuse for Hb 8. Will use 1 PRBC, start iron and folic acid. Recheck tomorrow, no evidence of hematoma.   Bowel regimen: last BM 02/22/2016 Diet: Cardiac and carb modified diet DVT Prophylaxis: subcutaneous Heparin  Advance goals of care discussion: Full code  Family Communication: no family was present at bedside, at the time of interview.  Disposition:  Discharge to likely SNF. Expected discharge date: 02/26/2016, postoperative Hb stabilization   Consultants: Orthopedic Procedures: ORIF of right femur fracture  Antibiotics: Anti-infectives    Start     Dose/Rate Route Frequency Ordered Stop   02/22/16 2000  vancomycin (VANCOCIN) IVPB 1000 mg/200 mL premix     1,000 mg 200 mL/hr over 60 Minutes Intravenous Every 12 hours 02/22/16 1501 02/23/16 1022   02/22/16 1500  vancomycin (VANCOCIN) IVPB 1000 mg/200 mL premix  Status:  Discontinued     1,000 mg 200 mL/hr over 60 Minutes Intravenous Every 12 hours 02/22/16 1459 02/22/16 1500   02/22/16  1415  ceFAZolin (ANCEF) IVPB 2g/100 mL premix  Status:  Discontinued     2 g 200 mL/hr over 30 Minutes Intravenous Every 6 hours 02/22/16 1404 02/22/16 1459   02/22/16 0715  vancomycin (VANCOCIN) 1,500 mg in sodium chloride 0.9 % 500 mL IVPB     1,500 mg 250 mL/hr over 120 Minutes Intravenous On call to O.R. 02/21/16 2046 02/22/16 0900   02/22/16 0600  ceFAZolin (ANCEF) 3 g in dextrose 5 % 50 mL IVPB  Status:  Discontinued     3 g 130 mL/hr over 30 Minutes Intravenous On call to O.R. 02/21/16 2046 02/21/16 2106      Subjective: Feeling better, pain well controlled. Has constipation   Objective: Physical Exam: Vitals:   02/24/16 2100 02/25/16 0530 02/25/16 1245 02/25/16 1319  BP: (!) 138/44 (!) 131/48 (!) 130/43 (!) 127/52  Pulse: 88 (!) 110 75 73  Resp:  17 18 18   Temp:  99.2 F (37.3 C) 99.7 F (37.6 C) 99.5 F (37.5 C)  TempSrc:  Oral Oral Oral  SpO2:  100% 98% 100%  Weight:      Height:        Intake/Output Summary (Last 24 hours) at 02/25/16 1447 Last data filed at 02/25/16 1319  Gross per 24 hour  Intake             1360 ml  Output             1000 ml  Net              360 ml   Filed Weights   02/21/16 0743 02/21/16 2042  Weight: 120.2 kg (265  lb) 96.3 kg (212 lb 4.9 oz)    General: Alert, Awake and Oriented to Time, Place and Person. Appear in mild distress, affect appropriate Eyes: PERRL, Conjunctiva normal ENT: Oral Mucosa clear moist. Neck: no JVD, no Abnormal Mass Or lumps Cardiovascular: S1 and S2 Present, no Murmur, Respiratory: Bilateral Air entry equal and Decreased, no use of accessory muscle, Clear to Auscultation, no Crackles, no wheezes Abdomen: Bowel Sound persent, Soft and no tenderness Skin: no redness, no Rash, no induration Extremities: no Pedal edema, no calf tenderness Neurologic: Grossly no focal neuro deficit. Bilaterally Equal motor strength  Data Reviewed: CBC:  Recent Labs Lab 02/23/16 0550 02/24/16 0536 02/24/16 1117  02/25/16 0133 02/25/16 1109  WBC 11.2* 11.9* 12.4* 11.3* 10.3  HGB 9.2* 7.8* 8.3* 7.2* 7.6*  HCT 27.5* 23.5* 25.4* 21.6* 22.7*  MCV 96.8 95.9 96.6 94.7 95.4  PLT 122* 122* 124* 121* 0000000*   Basic Metabolic Panel:  Recent Labs Lab 02/21/16 0905 02/22/16 0351 02/23/16 0550 02/24/16 0536 02/25/16 0133  NA 141 139 138 137 136  K 3.9 3.6 3.6 3.3* 3.3*  CL 107 103 105 106 104  CO2 26 26 27 26 27   GLUCOSE 160* 158* 187* 153* 144*  BUN 11 11 7  5* 7  CREATININE 0.94 0.92 0.88 0.88 0.91  CALCIUM 9.4 8.9 8.1* 7.9* 7.9*    Liver Function Tests: No results for input(s): AST, ALT, ALKPHOS, BILITOT, PROT, ALBUMIN in the last 168 hours. No results for input(s): LIPASE, AMYLASE in the last 168 hours. No results for input(s): AMMONIA in the last 168 hours. Coagulation Profile:  Recent Labs Lab 02/21/16 1331  INR 0.96   Cardiac Enzymes: No results for input(s): CKTOTAL, CKMB, CKMBINDEX, TROPONINI in the last 168 hours. BNP (last 3 results) No results for input(s): PROBNP in the last 8760 hours.  CBG:  Recent Labs Lab 02/24/16 1153 02/24/16 1659 02/24/16 2138 02/25/16 0638 02/25/16 1124  GLUCAP 193* 241* 226* 183* 211*    Studies: No results found.   Scheduled Meds: . sodium chloride   Intravenous Once  . aspirin EC  81 mg Oral Weekly  . atorvastatin  20 mg Oral q1800  . enoxaparin (LOVENOX) injection  40 mg Subcutaneous Q24H  . ferrous sulfate  325 mg Oral TID WC  . folic acid  1 mg Oral Daily  . hydrALAZINE  25 mg Oral Q8H  . insulin aspart  0-15 Units Subcutaneous TID WC  . insulin aspart  0-5 Units Subcutaneous QHS  . pantoprazole  40 mg Oral BID AC  . polyethylene glycol  17 g Oral Daily  . senna-docusate  1 tablet Oral BID   Continuous Infusions:  PRN Meds: acetaminophen **OR** acetaminophen, alum & mag hydroxide-simeth, hydrALAZINE, HYDROcodone-acetaminophen, HYDROmorphone (DILAUDID) injection, menthol-cetylpyridinium **OR** phenol, methocarbamol **OR**  methocarbamol (ROBAXIN)  IV, metoCLOPramide **OR** metoCLOPramide (REGLAN) injection, morphine injection, ondansetron **OR** ondansetron (ZOFRAN) IV, oxyCODONE, traZODone  Time spent: 30 minutes  Author: Berle Mull, MD [facility] Pager: [phone] 02/25/2016 2:47 PM  If 7PM-7AM, please contact night-coverage at [email or web address], password [facility]

## 2016-02-25 NOTE — Care Management Important Message (Signed)
Important Message  Patient Details  Name: Christian Estrada MRN: [id number] Date of Birth: 02-May-1945   Medicare Important Message Given:  Yes    Orbie Pyo 02/25/2016, 11:57 AM

## 2016-02-26 ENCOUNTER — Encounter (HOSPITAL_COMMUNITY): Payer: Self-pay | Admitting: Orthopaedic Surgery

## 2016-02-26 DIAGNOSIS — S72461A Displaced supracondylar fracture with intracondylar extension of lower end of right femur, initial encounter for closed fracture: Principal | ICD-10-CM

## 2016-02-26 LAB — TYPE AND SCREEN
ABO/RH(D): O POS
Antibody Screen: NEGATIVE
UNIT DIVISION: 0

## 2016-02-26 LAB — BASIC METABOLIC PANEL
ANION GAP: 9 (ref 5–15)
BUN: 10 mg/dL (ref 6–20)
CHLORIDE: 103 mmol/L (ref 101–111)
CO2: 27 mmol/L (ref 22–32)
Calcium: 8.4 mg/dL — ABNORMAL LOW (ref 8.9–10.3)
Creatinine, Ser: 0.9 mg/dL (ref 0.61–1.24)
GFR calc Af Amer: >60 mL/min (ref >60)
Glucose, Bld: 161 mg/dL — ABNORMAL HIGH (ref 65–99)
POTASSIUM: 3.5 mmol/L (ref 3.5–5.1)
SODIUM: 139 mmol/L (ref 135–145)

## 2016-02-26 LAB — CBC
HEMATOCRIT: 23.9 % — AB (ref 39.0–52.0)
HEMOGLOBIN: 8.1 g/dL — AB (ref 13.0–17.0)
MCH: 31.6 pg (ref 26.0–34.0)
MCHC: 33.9 g/dL (ref 30.0–36.0)
MCV: 93.4 fL (ref 78.0–100.0)
Platelets: 179 10*3/uL (ref 150–400)
RBC: 2.56 MIL/uL — ABNORMAL LOW (ref 4.22–5.81)
RDW: 13.4 % (ref 11.5–15.5)
WBC: 10.3 10*3/uL (ref 4.0–10.5)

## 2016-02-26 LAB — GLUCOSE, CAPILLARY
GLUCOSE-CAPILLARY: 183 mg/dL — AB (ref 65–99)
Glucose-Capillary: 196 mg/dL — ABNORMAL HIGH (ref 65–99)

## 2016-02-26 MED ORDER — GLYCERIN (LAXATIVE) 2.1 G RE SUPP
1.0000 | Freq: Every day | RECTAL | Status: DC | PRN
  Filled 2016-02-26: qty 1

## 2016-02-26 NOTE — Clinical Social Work Placement (Signed)
   CLINICAL SOCIAL WORK PLACEMENT  NOTE  Date:  02/26/2016  Patient Details  Name: Christian Estrada MRN: [id number] Date of Birth: 1945-10-30  Clinical Social Work is seeking post-discharge placement for this patient at the [facility] level of care (*CSW will initial, date and re-position this form in  chart as items are completed):      Patient/family provided with [place] Work Department's list of facilities offering this level of care within the geographic area requested by the patient (or if unable, by the patient's family).  Yes   Patient/family informed of their freedom to choose among providers that offer the needed level of care, that participate in Medicare, Medicaid or managed care program needed by the patient, have an available bed and are willing to accept the patient.      Patient/family informed of [facility]'s ownership interest in [facility] and [facility], as well as of the fact that they are under no obligation to receive care at these facilities.  PASRR submitted to EDS on       PASRR number received on 02/26/16     Existing PASRR number confirmed on       FL2 transmitted to all facilities in geographic area requested by pt/family on 02/26/16     FL2 transmitted to all facilities within larger geographic area on       Patient informed that his/her managed care company has contracts with or will negotiate with certain facilities, including the following:        Yes   Patient/family informed of bed offers received.  Patient chooses bed at [facility]     Physician recommends and patient chooses bed at      Patient to be transferred to [facility] on 02/26/16.  Patient to be transferred to facility by [facility]     Patient family notified on 02/26/16 of transfer.  Name of family member notified:  [name]     PHYSICIAN Please prepare priority discharge summary, including medications     Additional Comment:     _______________________________________________ Alla German, LCSW 02/26/2016, 2:07 PM

## 2016-02-26 NOTE — Discharge Summary (Signed)
Physician Discharge Summary  Christian Estrada:[id number] DOB: 11-27-1945 DOA: 02/21/2016  PCP: Kandice Hams, MD  Admit date: 02/21/2016 Discharge date: 02/26/2016  Admitted From: home Disposition:  SNF  Recommendations for Outpatient Follow-up:  1. Follow up with PCP in 1-2 weeks 2. Follow up with Dr. Erlinda Hong in 2 weeks 3. NWB operative extremity, unlocked bledsoe at all times 4. Check CBC in 4-5 days   Discharge Condition: stable CODE STATUS: Full code Diet recommendation: regular  HPI: Per Christian Carrel, NP, Christian Estrada is a 71 y.o. male with medical history significant for DM, HTN, hyperlipidemia, gerd, severe emergency Department chief complaint right knee pain resulting from a mechanical fall. Initial evaluation reveals comminuted displaced oblique distal RIGHT femoral metadiaphyseal fracture.  Information obtained from patient and wife who is at bedside. 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 Reports immediate pain and inability to get up. He denies hitting his head losing consciousness. Denies headache, dizziness, visual disturbances syncope or near syncope. Denies chest pain, palpitations sob LE edema cough fever chills. Denies abdominal pain diarrhea constipation melena. Denies dysuria hematuria frequency or urgency. He denies fever chills recent travel or sick contacts. He reports someone observed him falling and EMS was called.  Hospital Course: Discharge Diagnoses:  Principal Problem:   Femur fracture, right ([place]) Active Problems:   Anemia   Diabetes mellitus (HCC)   HTN (hypertension)   Prostate cancer (HCC)   GERD (gastroesophageal reflux disease)   Postoperative anemia due to acute blood loss  Right femur fracture - patient admitted to the hospital and orthopedic surgery was consulted. He is S/P ORIF 02/22/2016, patient tolerated procedure well. PT evaluation post op recommending  SNF  Hypertension - Blood pressure well controlled, resume amlodipine, resume aspirin.  Type 2 diabetes mellitus - resume home medications  HLD - Continue lipitor  Post-op blood loss anemia - received 1U pRBC 1/23, Hb stable. No bleeding noted. Recheck CBC in 3-4 days post discharge  Discharge Instructions  Discharge Instructions    Diet - low sodium heart healthy    Complete by:  As directed    Diet Carb Modified    Complete by:  As directed    Discharge instructions    Complete by:  As directed    It is important that you read following instructions as well as go over your medication list with RN to help you understand your care after this hospitalization.  Discharge Instructions: Please follow-up with PCP in one week  Please request your primary care physician to go over all Hospital Tests and Procedure/Radiological results at the follow up,  Please get all Hospital records sent to your PCP by signing hospital release before you go home.   Do not drive, operating heavy machinery, perform activities at heights, swimming or participation in water activities or provide baby sitting services ; until you have been seen by Primary Care Physician or a Neurologist and advised to do so again. Do not take more than prescribed Pain, Sleep and Anxiety Medications. You were cared for by a hospitalist during your hospital stay. If you have any questions about your discharge medications or the care you received while you were in the hospital after you are discharged, you can call the unit and ask to speak with the hospitalist on call if the hospitalist that took care of you is not available.  Once you are discharged, your primary  care physician will handle any further medical issues. Please note that NO REFILLS for any discharge medications will be authorized once you are discharged, as it is imperative that you return to your primary care physician (or establish a relationship with a primary  care physician if you do not have one) for your aftercare needs so that they can reassess your need for medications and monitor your lab values. You Must read complete instructions/literature along with all the possible adverse reactions/side effects for all the Medicines you take and that have been prescribed to you. Take any new Medicines after you have completely understood and accept all the possible adverse reactions/side effects. Wear Seat belts while driving. If you have smoked or chewed Tobacco in the last 2 yrs please stop smoking and/or stop any Recreational drug use.   Increase activity slowly    Complete by:  As directed    Non weight bearing    Complete by:  As directed      Allergies as of 02/26/2016      Reactions   Penicillins Other (See Comments)   Unknown-reaction years ago      Medication List    STOP taking these medications   valsartan-hydrochlorothiazide 320-12.5 MG tablet Commonly known as:  DIOVAN-HCT     TAKE these medications   ACTOPLUS MET XR 30-1000 MG Tb24 Generic drug:  Pioglitazone HCl-Metformin HCl Take 1 tablet by mouth daily with supper.   amlodipine-atorvastatin 10-20 MG tablet Commonly known as:  CADUET Take 1 tablet by mouth daily.   aspirin 81 MG tablet Take 81 mg by mouth once a week.   enoxaparin 40 MG/0.4ML injection Commonly known as:  LOVENOX Inject 0.4 mLs (40 mg total) into the skin daily.   ferrous sulfate 325 (65 FE) MG tablet Take 1 tablet (325 mg total) by mouth 3 (three) times daily with meals.   folic acid 1 MG tablet Commonly known as:  FOLVITE Take 1 tablet (1 mg total) by mouth daily.   hydrALAZINE 25 MG tablet Commonly known as:  APRESOLINE Take 1 tablet (25 mg total) by mouth every 8 (eight) hours.   methocarbamol 500 MG tablet Commonly known as:  ROBAXIN Take 1 tablet (500 mg total) by mouth every 6 (six) hours as needed for muscle spasms.   multivitamin with minerals Tabs tablet Take 1 tablet by mouth  every morning.   ONGLYZA 5 MG Tabs tablet Generic drug:  saxagliptin HCl Take 5 mg by mouth daily.   oxyCODONE-acetaminophen 5-325 MG tablet Commonly known as:  PERCOCET Take 1-2 tablets by mouth every 4 (four) hours as needed for severe pain.   pantoprazole 40 MG tablet Commonly known as:  PROTONIX Take 1 tablet (40 mg total) by mouth daily.   polyethylene glycol packet Commonly known as:  MIRALAX / GLYCOLAX Take 17 g by mouth daily.   senna-docusate 8.6-50 MG tablet Commonly known as:  Senokot-S Take 1 tablet by mouth 2 (two) times daily.   vitamin C 500 MG tablet Commonly known as:  ASCORBIC ACID Take 500 mg by mouth daily.      Follow-up Information    Eduard Roux, MD Follow up in 2 week(s).   Specialty:  Orthopedic Surgery Why:  For suture removal, For wound re-check Contact information: [place] [address] [phone]        POLITE,RONALD D, MD. Schedule an appointment as soon as possible for a visit in 1 week(s).   Specialty:  Internal Medicine Why:  with CBC Contact information: [address] [place] [address] [phone]          Allergies  Allergen Reactions  . Penicillins Other (See Comments)    Unknown-reaction years ago   Consultations:  Orthopedic surgery   Procedures/Studies: ORIF of right femur fracture  Ct Knee Right Wo Contrast  Result Date: 02/21/2016 CLINICAL DATA:  Fall on ice today.  Evaluate distal femur fracture. EXAM: CT OF THE RIGHT KNEE WITHOUT CONTRAST TECHNIQUE: Multidetector CT imaging of the right knee was performed according to the standard protocol. Multiplanar CT image reconstructions were also generated. COMPARISON:  Radiographs same date. FINDINGS: Bones/Joint/Cartilage Again demonstrated is a comminuted, impacted and significantly displaced fracture of the distal right femoral diaphysis. This fracture demonstrates up to 3.9 cm of lateral displacement and up to 3.1  cm of impaction. There is also a lateral rotary component. There is distal intercondylar extension of the fracture in the sagittal plane, extending into the intercondylar notch. This component is only mildly displaced but does involve the articular surface of the medial trochlea. There is no involvement of the weight-bearing articular surface of the femoral condyle. The proximal tibia, proximal fibula and patella are intact. There is a small lipohemarthrosis. No large intra-articular fracture fragments are seen. Ligaments Suboptimally evaluated by CT.  The cruciate ligaments appear intact. Muscles and Tendons The extensor mechanism is intact. No large intramuscular hematoma identified. Soft tissues There is no soft tissue emphysema or foreign body. Moderate femoral popliteal atherosclerosis noted. No evidence of acute vascular injury. IMPRESSION: 1. Comminuted and significantly displaced fracture of the distal femora as described. As discussed, this fracture demonstrates distal intercondylar extension with involvement of the medial trochlea. There is no involvement of the weight-bearing articular surface. 2. Associated lipohemarthrosis. 3. No dislocation or tibial fracture. Electronically Signed   By: Richardean Sale M.D.   On: 02/21/2016 11:15   Dg Chest Port 1 View  Result Date: 02/21/2016 CLINICAL DATA:  Preoperative evaluation, history hypertension, diabetes mellitus, prostate cancer EXAM: PORTABLE CHEST 1 VIEW COMPARISON:  Portable exam 0840 hours compared 09/04/2005 FINDINGS: Normal heart size, mediastinal contours, and pulmonary vascularity. Atherosclerotic calcification aorta. Lungs clear. No pleural effusion or pneumothorax. Bones unremarkable. IMPRESSION: No acute abnormalities. Aortic atherosclerosis. Electronically Signed   By: Lavonia Dana M.D.   On: 02/21/2016 08:57   Dg Knee Right Port  Result Date: 02/21/2016 CLINICAL DATA:  Knee injury EXAM: PORTABLE RIGHT KNEE - 1-2 VIEW COMPARISON:   Portable exam 0838 hours without priors for comparison FINDINGS: Osseous demineralization. Comminuted oblique fracture of the distal RIGHT femoral metadiaphysis with posterior and lateral displacement as well as overriding and mild apex medial angulation. Additionally, a longitudinal fracture plane is identified within the distal femoral metaphyseal fragment extending intra-articular at the intercondylar notch. Posteriorly displaced bone fragment at the fracture site. Patella, tibia, and fibula appear intact. No additional fracture or dislocation. Associated soft tissue deformity and swelling. Atherosclerotic calcifications of distal superficial femoral and popliteal arteries extending into trifurcation vessels. IMPRESSION: Comminuted displaced oblique distal RIGHT femoral metadiaphyseal fracture with intra-articular extension of a longitudinal fracture plane into the intercondylar notch at the RIGHT knee. Electronically Signed   By: Lavonia Dana M.D.   On: 02/21/2016 08:52   Dg C-arm Gt 120 Min  Result Date: 02/22/2016 CLINICAL DATA:  ORIF right distal femur fracture EXAM: RIGHT FEMUR 2 VIEWS; DG C-ARM GT 120 MIN COMPARISON:  02/21/2016 right femur radiograph FINDINGS: Fluoroscopy time 6 minutes 36 seconds. Multiple nondiagnostic  spot fluoroscopic intraoperative radiographs of the right femur demonstrate postsurgical changes from ORIF comminuted distal right femur fracture, with transfixation of the fracture by a lateral surgical plate with multiple interlocking screws. IMPRESSION: Intraoperative fluoroscopic guidance for ORIF comminuted right distal femur fracture. Electronically Signed   By: Ilona Sorrel M.D.   On: 02/22/2016 11:51   Dg Femur, Min 2 Views Right  Result Date: 02/22/2016 CLINICAL DATA:  ORIF right distal femur fracture EXAM: RIGHT FEMUR 2 VIEWS; DG C-ARM GT 120 MIN COMPARISON:  02/21/2016 right femur radiograph FINDINGS: Fluoroscopy time 6 minutes 36 seconds. Multiple nondiagnostic spot  fluoroscopic intraoperative radiographs of the right femur demonstrate postsurgical changes from ORIF comminuted distal right femur fracture, with transfixation of the fracture by a lateral surgical plate with multiple interlocking screws. IMPRESSION: Intraoperative fluoroscopic guidance for ORIF comminuted right distal femur fracture. Electronically Signed   By: Ilona Sorrel M.D.   On: 02/22/2016 11:51   Dg Femur, Min 2 Views Right  Result Date: 02/21/2016 CLINICAL DATA:  Pain following fall EXAM: RIGHT FEMUR 2 VIEWS COMPARISON:  None. FINDINGS: Frontal and lateral views were obtained. There is a comminuted fracture of the distal femoral diaphysis with posterior displacement of the distal major fracture fragment with respect to the proximal fragment. There is a approximately 5 cm of overriding of fracture fragments in this area. There is a joint effusion. No other fractures. No dislocations. There is extensive arterial vascular calcification. IMPRESSION: Comminuted displaced fracture distal femoral diaphysis. Joint effusion. No dislocation. Extensive atherosclerotic vascular calcification. Electronically Signed   By: Lowella Grip III M.D.   On: 02/21/2016 10:26   Dg Femur Port, Min 2 Views Right  Result Date: 02/22/2016 CLINICAL DATA:  Postop right femur fracture repair EXAM: RIGHT FEMUR PORTABLE 2 VIEW COMPARISON:  None. FINDINGS: Severely comminuted distal right femoral diaphysis and metaphysis fracture transfixed with a lateral collateral sideplate with multiple interlocking screws. Fracture is in near anatomic alignment. No hardware failure complication. Postsurgical changes in the soft tissues adjacent to the right femur. Peripheral vascular atherosclerotic disease. IMPRESSION: Interval ORIF right distal femoral comminuted fracture. Electronically Signed   By: Kathreen Devoid   On: 02/22/2016 14:37      Subjective: - no chest pain, shortness of breath, no abdominal pain, nausea or vomiting.    Discharge Exam: Vitals:   02/25/16 2120 02/26/16 0414  BP: (!) 124/56 (!) 133/50  Pulse: 66 98  Resp: 18 18  Temp: 98.9 F (37.2 C) 98.5 F (36.9 C)   Vitals:   02/25/16 1319 02/25/16 1535 02/25/16 2120 02/26/16 0414  BP: (!) 127/52 (!) 140/52 (!) 124/56 (!) 133/50  Pulse: 73 77 66 98  Resp: _0 Temp: 99.5 F (37.5 C) 99.5 F (37.5 C) 98.9 F (37.2 C) 98.5 F (36.9 C)  TempSrc: Oral Oral Oral Oral  SpO2: 100% 98% 98% 97%  Weight:      Height:        General: Pt is alert, awake, not in acute distress Cardiovascular: RRR, S1/S2 +, no rubs, no gallops Respiratory: CTA bilaterally, no wheezing, no rhonchi Abdominal: Soft, NT, ND, bowel sounds + Extremities: no edema, no cyanosis    The results of significant diagnostics from this hospitalization (including imaging, microbiology, ancillary and laboratory) are listed below for reference.     Microbiology: Recent Results (from the past 240 hour(s))  Surgical pcr screen     Status: None   Collection Time: 02/21/16  9:15 PM  Result Value  Ref Range Status   MRSA, PCR NEGATIVE NEGATIVE Final   Staphylococcus aureus NEGATIVE NEGATIVE Final    Comment:        The Xpert SA Assay (FDA approved for NASAL specimens in patients over 26 years of age), is one component of a comprehensive surveillance program.  Test performance has been validated by [facility], [facility], [facility] for patients greater than or equal to 16 year old. It is not intended to diagnose infection nor to guide or monitor treatment.      Labs: BNP (last 3 results) No results for input(s): BNP in the last 8760 hours. Basic Metabolic Panel:  Recent Labs Lab 02/22/16 0351 02/23/16 0550 02/24/16 0536 02/25/16 0133 02/26/16 0407  NA 139 138 137 136 139  K 3.6 3.6 3.3* 3.3* 3.5  CL 103 105 106 104 103  CO2 _0 GLUCOSE 158* 187* 153* 144* 161*  BUN 11 7 5* 7 10  CREATININE 0.92 0.88 0.88 0.91 0.90  CALCIUM 8.9 8.1* 7.9* 7.9* 8.4*   Liver  Function Tests: No results for input(s): AST, ALT, ALKPHOS, BILITOT, PROT, ALBUMIN in the last 168 hours. No results for input(s): LIPASE, AMYLASE in the last 168 hours. No results for input(s): AMMONIA in the last 168 hours. CBC:  Recent Labs Lab 02/24/16 1117 02/25/16 0133 02/25/16 1109 02/25/16 1754 02/26/16 0407  WBC 12.4* 11.3* 10.3 11.6* 10.3  HGB 8.3* 7.2* 7.6* 8.4* 8.1*  HCT 25.4* 21.6* 22.7* 25.3* 23.9*  MCV 96.6 94.7 95.4 93.7 93.4  PLT 124* 121* 137* 164 179   Cardiac Enzymes: No results for input(s): CKTOTAL, CKMB, CKMBINDEX, TROPONINI in the last 168 hours. BNP: Invalid input(s): POCBNP CBG:  Recent Labs Lab 02/25/16 1124 02/25/16 1704 02/25/16 2119 02/26/16 0646 02/26/16 1128  GLUCAP 211* 179* 161* 183* 196*   D-Dimer No results for input(s): DDIMER in the last 72 hours. Hgb A1c No results for input(s): HGBA1C in the last 72 hours. Lipid Profile No results for input(s): CHOL, HDL, LDLCALC, TRIG, CHOLHDL, LDLDIRECT in the last 72 hours. Thyroid function studies No results for input(s): TSH, T4TOTAL, T3FREE, THYROIDAB in the last 72 hours.  Invalid input(s): FREET3 Anemia work up No results for input(s): VITAMINB12, FOLATE, FERRITIN, TIBC, IRON, RETICCTPCT in the last 72 hours. Urinalysis    Component Value Date/Time   COLORURINE YELLOW 02/21/2016 1206   APPEARANCEUR CLEAR 02/21/2016 1206   LABSPEC 1.024 02/21/2016 1206   PHURINE 5.0 02/21/2016 [place] 02/21/2016 [place] 02/21/2016 [place] 02/21/2016 1206   KETONESUR NEGATIVE 02/21/2016 1206   PROTEINUR NEGATIVE 02/21/2016 1206   NITRITE NEGATIVE 02/21/2016 1206   LEUKOCYTESUR NEGATIVE 02/21/2016 1206   Sepsis Labs Invalid input(s): PROCALCITONIN,  WBC,  LACTICIDVEN Microbiology Recent Results (from the past 240 hour(s))  Surgical pcr screen     Status: None   Collection Time: 02/21/16  9:15 PM  Result Value Ref Range Status   MRSA, PCR  NEGATIVE NEGATIVE Final   Staphylococcus aureus NEGATIVE NEGATIVE Final    Comment:        The Xpert SA Assay (FDA approved for NASAL specimens in patients over 31 years of age), is one component of a comprehensive surveillance program.  Test performance has been validated by [facility] for patients greater than or equal to 27 year old. It is not intended to diagnose infection nor to guide or monitor treatment.      Time coordinating discharge: Over 30  minutes  SIGNED:  Marzetta Board, MD  [facility] 02/26/2016, 2:00 PM Pager [phone]  If 7PM-7AM, please contact night-coverage [email or web address] Password [email or web address]

## 2016-02-26 NOTE — Progress Notes (Signed)
Attempted to call report [facility] for third time.  [phone] unavailable and mailbox if full.

## 2016-02-26 NOTE — Progress Notes (Signed)
Attempted to call report  X 2 to [phone].  Voice mail says unavailable and mail box is full.

## 2016-02-26 NOTE — Progress Notes (Signed)
Occupational Therapy Treatment Patient Details Name: Christian Estrada MRN: [id number] DOB: 1945-02-10 Today's Date: 02/26/2016    History of present illness Pt is a 71 yo male admitted on 02/21/16 following a fall on the ice resulting in a right supracondylar femur fx. Pt underwent on ORIF on 02/22/16. PMH is significant for HTN, DM2, HLD, GERD, Prostate CA.    OT comments  Pt making progress towards OT goals this session. Pt able to perform sink level grooming min A. Pt continues to benefit from skilled OT in the acute care setting to maximize independence and safety in ADL. Pt requires SNF level therapy at discharge to return to PLOF. Next session to focus on LB dressing (AE intro) and functional transfers.   Follow Up Recommendations  SNF;Supervision/Assistance - 24 hour (initially)    Equipment Recommendations  Other (comment) (defer to next venue of care)    Recommendations for Other Services      Precautions / Restrictions Precautions Precautions: Fall Required Braces or Orthoses: Other Brace/Splint Other Brace/Splint: bledsoe brace RLE Restrictions Weight Bearing Restrictions: Yes RLE Weight Bearing: Non weight bearing       Mobility Bed Mobility Overal bed mobility: Needs Assistance Bed Mobility: Supine to Sit     Supine to sit: Min assist;HOB elevated     General bed mobility comments: pt with heavy use of rail for supine to sit but able to perform with less physical assistance. Min A at RLE and to support once at EOB and min A from behind at hips.  Transfers Overall transfer level: Needs assistance Equipment used: Rolling walker (2 wheeled) Transfers: Sit to/from Stand Sit to Stand: Mod assist;+2 physical assistance         General transfer comment: vc's for hand placement and for keeping RLE NWB. Mod A +2 for power up.     Balance Overall balance assessment: Needs assistance Sitting-balance support: Bilateral upper extremity supported Sitting  balance-Leahy Scale: Fair Sitting balance - Comments: heavy lean to left away from hurt leg Postural control: Left lateral lean Standing balance support: Bilateral upper extremity supported Standing balance-Leahy Scale: Poor Standing balance comment: Relies on RW stability with transitional movements                   ADL Overall ADL's : Needs assistance/impaired     Grooming: Wash/dry hands;Minimal assistance;Standing Grooming Details (indicate cue type and reason): sink level                 Toilet Transfer: Minimal assistance;Ambulation;BSC;RW Toilet Transfer Details (indicate cue type and reason): vc for safe hand placement, sequencing Toileting- Clothing Manipulation and Hygiene: Moderate assistance;Sitting/lateral lean Toileting - Clothing Manipulation Details (indicate cue type and reason): Mod assist for sit<>stand and to maintain balance while performing pericare.     Functional mobility during ADLs: Minimal assistance;Rolling walker (vc for NWB)        Vision                     Perception     Praxis      Cognition   Behavior During Therapy: WFL for tasks assessed/performed Overall Cognitive Status: Within Functional Limits for tasks assessed                       Extremity/Trunk Assessment               Exercises     Shoulder Instructions       General  Comments      Pertinent Vitals/ Pain       Pain Assessment: 0-10 Pain Score: 5  Pain Location: right LE Pain Descriptors / Indicators: Aching;Grimacing;Sore Pain Intervention(s): Monitored during session;Repositioned;Patient requesting pain meds-RN notified  Home Living                                          Prior Functioning/Environment              Frequency  Min 2X/week        Progress Toward Goals  OT Goals(current goals can now be found in the care plan section)  Progress towards OT goals: Progressing toward goals  Acute  Rehab OT Goals Patient Stated Goal: to go home and get back to walking OT Goal Formulation: With patient/family Time For Goal Achievement: 03/01/16 Potential to Achieve Goals: Good  Plan Discharge plan remains appropriate    Co-evaluation    PT/OT/SLP Co-Evaluation/Treatment: Yes Reason for Co-Treatment: For patient/therapist safety;To address functional/ADL transfers PT goals addressed during session: Mobility/safety with mobility OT goals addressed during session: ADL's and self-care      End of Session Equipment Utilized During Treatment: Gait belt;Rolling walker   Activity Tolerance Patient tolerated treatment well   Patient Left in chair;with call bell/phone within reach;with family/visitor present   Nurse Communication Mobility status;Patient requests pain meds;Weight bearing status (NWB written on board)        Time: [id number] OT Time Calculation (min): 30 min  Charges: OT General Charges $OT Visit: 1 Procedure OT Treatments $Self Care/Home Management : 8-22 mins  Merri Ray Aris Even 02/26/2016, 1:06 PM  Hulda Humphrey OTR/L [phone]

## 2016-02-26 NOTE — Clinical Social Work Note (Signed)
Clinical Social Worker facilitated patient discharge including contacting patient family and facility to confirm patient discharge plans.  Clinical information faxed to facility and family agreeable with plan.  CSW arranged ambulance transport via [facility] to [facility].  RN to call [phone] for report prior to discharge.  Clinical Social Worker will sign off for now as social work intervention is no longer needed. Please consult [place] again if new need arises.  [address] [place]

## 2016-02-26 NOTE — Progress Notes (Signed)
Physical Therapy Treatment Patient Details Name: Christian Estrada MRN: [id number] DOB: 02-16-1945 Today's Date: 02/26/2016    History of Present Illness Pt is a 71 yo male admitted on 02/21/16 following a fall on the ice resulting in a right supracondylar femur fx. Pt underwent on ORIF on 02/22/16. PMH is significant for HTN, DM2, HLD, GERD, Prostate CA.     PT Comments    Patient is making progress toward mobility goals. Increased activity tolerance this session. Continue to progress as tolerated with anticipated d/c to SNF for further skilled PT services.    Follow Up Recommendations  SNF;Supervision/Assistance - 24 hour     Equipment Recommendations  Rolling walker with 5" wheels    Recommendations for Other Services       Precautions / Restrictions Precautions Precautions: Fall Required Braces or Orthoses: Other Brace/Splint Other Brace/Splint: bledsoe brace RLE Restrictions Weight Bearing Restrictions: Yes RLE Weight Bearing: Non weight bearing    Mobility  Bed Mobility Overal bed mobility: Needs Assistance Bed Mobility: Supine to Sit     Supine to sit: Min assist;HOB elevated     General bed mobility comments: pt with heavy use of rail for supine to sit but able to perform with less physical assistance. Min A at RLE and to support once at EOB and min A from behind at hips.  Transfers Overall transfer level: Needs assistance Equipment used: Rolling walker (2 wheeled) Transfers: Sit to/from Stand Sit to Stand: Mod assist;+2 physical assistance         General transfer comment: from EOB and BSC; vc's for hand placement and for keeping RLE NWB. Mod A +2 for power up and to maintain NWB status   Ambulation/Gait Ambulation/Gait assistance: Min assist;+2 physical assistance Ambulation Distance (Feet): 35 Feet Assistive device: Rolling walker (2 wheeled) Gait Pattern/deviations: Step-to pattern     General Gait Details: L tennis shoe donned; cues for posture,  proximity of RW, and NWB R LE; pt with improved ability to maintain WB status; limity by UE fatigue   Stairs            Wheelchair Mobility    Modified Rankin (Stroke Patients Only)       Balance Overall balance assessment: Needs assistance Sitting-balance support: Bilateral upper extremity supported Sitting balance-Leahy Scale: Fair Sitting balance - Comments: heavy lean to left away from hurt leg Postural control: Left lateral lean Standing balance support: Bilateral upper extremity supported Standing balance-Leahy Scale: Poor Standing balance comment: Relies on RW stability with transitional movements                    Cognition Arousal/Alertness: Awake/alert Behavior During Therapy: WFL for tasks assessed/performed Overall Cognitive Status: Within Functional Limits for tasks assessed                      Exercises      General Comments General comments (skin integrity, edema, etc.): wife present in room      Pertinent Vitals/Pain Pain Assessment: 0-10 Pain Score: 5  Pain Location: right LE Pain Descriptors / Indicators: Aching;Grimacing;Sore Pain Intervention(s): Monitored during session;Repositioned;Patient requesting pain meds-RN notified    Home Living                      Prior Function            PT Goals (current goals can now be found in the care plan section) Acute Rehab PT Goals Patient Stated Goal:  to go home and get back to walking Progress towards PT goals: Progressing toward goals    Frequency    Min 3X/week      PT Plan Current plan remains appropriate    Co-evaluation   Reason for Co-Treatment: For patient/therapist safety;To address functional/ADL transfers PT goals addressed during session: Mobility/safety with mobility OT goals addressed during session: ADL's and self-care     End of Session Equipment Utilized During Treatment: Gait belt Activity Tolerance: Patient tolerated treatment  well Patient left: in chair;with call bell/phone within reach;with family/visitor present     Time: 1138-1208 PT Time Calculation (min) (ACUTE ONLY): 30 min  Charges:  $Gait Training: 8-22 mins                    G Codes:      [place] April, PTA Pager: [phone]   02/26/2016, 1:15 PM

## 2016-02-27 ENCOUNTER — Other Ambulatory Visit: Payer: Self-pay

## 2016-02-27 ENCOUNTER — Non-Acute Institutional Stay (SKILLED_NURSING_FACILITY): Payer: Medicare Other | Admitting: Adult Health

## 2016-02-27 ENCOUNTER — Encounter: Payer: Self-pay | Admitting: Adult Health

## 2016-02-27 DIAGNOSIS — I10 Essential (primary) hypertension: Secondary | ICD-10-CM | POA: Diagnosis not present

## 2016-02-27 DIAGNOSIS — E118 Type 2 diabetes mellitus with unspecified complications: Secondary | ICD-10-CM

## 2016-02-27 DIAGNOSIS — S72461S Displaced supracondylar fracture with intracondylar extension of lower end of right femur, sequela: Secondary | ICD-10-CM | POA: Diagnosis not present

## 2016-02-27 DIAGNOSIS — R339 Retention of urine, unspecified: Secondary | ICD-10-CM

## 2016-02-27 DIAGNOSIS — K5901 Slow transit constipation: Secondary | ICD-10-CM | POA: Diagnosis not present

## 2016-02-27 DIAGNOSIS — K219 Gastro-esophageal reflux disease without esophagitis: Secondary | ICD-10-CM | POA: Diagnosis not present

## 2016-02-27 DIAGNOSIS — R2681 Unsteadiness on feet: Secondary | ICD-10-CM

## 2016-02-27 DIAGNOSIS — D62 Acute posthemorrhagic anemia: Secondary | ICD-10-CM | POA: Diagnosis not present

## 2016-02-27 MED ORDER — OXYCODONE-ACETAMINOPHEN 5-325 MG PO TABS: 1.0000 | ORAL_TABLET | ORAL | 0 refills | Status: DC | PRN

## 2016-02-27 NOTE — Telephone Encounter (Signed)
Rx faxed to [facility] @ [phone], phone number [phone]  

## 2016-02-27 NOTE — Progress Notes (Signed)
DATE:  02/27/2016   MRN:  [id number]  BIRTHDAY: 06-08-1945  Facility:  Nursing Home Location:  [place] Room Number: 903-A  LEVEL OF CARE:  SNF [phone])  Contact Information    Name Relation Home Work Mobile   [place] Spouse [phone]  [phone]   Eulises, [place] Daughter   [phone]       Code Status History    Date Active Date Inactive Code Status Order ID Comments User Context   02/21/2016 10:03 AM 02/26/2016  8:02 PM Full Code [id number]  Radene Gunning, NP ED   09/10/2011  9:18 PM 09/12/2011 12:49 PM Full Code [id number]  Earleen Reaper, RN Inpatient       Chief Complaint  Patient presents with  . Hospitalization Follow-up    HISTORY OF PRESENT ILLNESS:  This is a 41-YO male seen for hospital followup.  He was admitted to [place] on 02/26/2016 for short-term rehabilitation following an admission at [facility] 02/21/2016-02/26/2016 for a slip and fall on ice with subsequent right knee pain.  Evaluation revealed a comminuted displaced oblique distal right femoral metadiaphyseal fracture and he underwent ORIF 02/22/2016. He had acute blood loss anemia post-op and was transfused 1 unit PRBC. He has PMH of hypertension, diabetes mellitus, hyperlipidemia and GERD.    PAST MEDICAL HISTORY:  Past Medical History:  Diagnosis Date  . Allergy    pcn  . Back pain   . BPH with obstruction/lower urinary tract symptoms   . Diabetes mellitus   . Dysrhythmia   . ED (erectile dysfunction)   . GERD (gastroesophageal reflux disease)   . Hypercholesterolemia   . Hypertension   . 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 Radiation 04/07/12-06/03/12   7800 cGy,seminal vesicles 5600 cGy  . Reflux   . Shortness of breath      CURRENT MEDICATIONS: Reviewed  Patient's Medications  New Prescriptions   No medications on file  Previous Medications   AMLODIPINE-ATORVASTATIN (CADUET) 10-20 MG PER TABLET    Take 1 tablet by mouth daily.   ASPIRIN 81 MG TABLET    Take 81 mg by mouth once a week. On Thursdays   ENOXAPARIN (LOVENOX) 40 MG/0.4ML INJECTION    Inject 0.4 mLs (40 mg total) into the skin daily.   FERROUS SULFATE 325 (65 FE) MG EC TABLET    Take 325 mg by mouth 2 (two) times daily.   FOLIC ACID (FOLVITE) 1 MG TABLET    Take 1 tablet (1 mg total) by mouth daily.   HYDRALAZINE (APRESOLINE) 25 MG TABLET    Take 1 tablet (25 mg total) by mouth every 8 (eight) hours.   METHOCARBAMOL (ROBAXIN) 500 MG TABLET    Take 1 tablet (500 mg total) by mouth every 6 (six) hours as needed for muscle spasms.   MULTIPLE VITAMIN (MULTIVITAMIN WITH MINERALS) TABS    Take 1 tablet by mouth every morning.   ONGLYZA 5 MG TABS TABLET    Take 5 mg by mouth daily.    OXYCODONE-ACETAMINOPHEN (PERCOCET) 5-325 MG TABLET    Take 1-2 tablets by mouth every 4 (four) hours as needed for severe pain.   PANTOPRAZOLE (PROTONIX) 40 MG TABLET    Take 1 tablet (40 mg total) by mouth daily.   PIOGLITAZONE HCL-METFORMIN HCL (ACTOPLUS MET XR) 30-1000 MG TB24    Take 1 tablet by mouth daily with supper.   POLYETHYLENE GLYCOL (MIRALAX /  GLYCOLAX) PACKET    Take 17 g by mouth daily.   SENNA-DOCUSATE (SENOKOT-S) 8.6-50 MG TABLET    Take 1 tablet by mouth 2 (two) times daily.   VITAMIN C (ASCORBIC ACID) 500 MG TABLET    Take 500 mg by mouth daily.  Modified Medications   No medications on file  Discontinued Medications   FERROUS SULFATE 325 (65 FE) MG TABLET    Take 1 tablet (325 mg total) by mouth 3 (three) times daily with meals.     Allergies  Allergen Reactions  . Penicillins Other (See Comments)    Unknown-reaction years ago     REVIEW OF SYSTEMS:  GENERAL: no change in appetite, no fatigue, no weight changes, no fever, chills or weakness EYES: Denies change in vision, dry eyes, eye pain, itching or discharge EARS: Denies change in hearing, ringing in ears, or  earache NOSE: Denies nasal congestion or epistaxis MOUTH and THROAT: Denies oral discomfort, gingival pain or bleeding, pain from teeth or hoarseness   RESPIRATORY: no cough, SOB, DOE, wheezing, hemoptysis CARDIAC: no chest pain or palpitations GI: no abdominal pain, diarrhea, constipation, heart burn, nausea or vomiting GU: foley catheter was recently discontinued PSYCHIATRIC: Denies feeling of depression or anxiety. No report of hallucinations, insomnia, paranoia, or agitation   PHYSICAL EXAMINATION  GENERAL APPEARANCE: Well nourished. In no acute distress. Normal body habitus SKIN:  Right hip lateral and anterior thigh surgical incision is covered with mepilex, moist, no redness HEAD: Normal in size and contour. No evidence of trauma EYES: Lids open and close normally. No blepharitis, entropion or ectropion. PERRL. Conjunctivae are clear and sclerae are white. Lenses are without opacity EARS: Pinnae are normal. Patient hears normal voice tunes of the examiner MOUTH and THROAT: Lips are without lesions. Oral mucosa is moist and without lesions. Tongue is normal in shape, size, and color and without lesions NECK: supple, trachea midline, no neck masses, no thyroid tenderness, no thyromegaly LYMPHATICS: no LAN in the neck, no supraclavicular LAN RESPIRATORY: breathing is even & unlabored, BS CTAB CARDIAC: irregular heart rate, no murmur,no extra heart sounds, right foot 2+ edema GI: abdomen soft, normal BS, no masses, no tenderness, no hepatomegaly, no splenomegaly EXTREMITIES:  Able to move X 4 extremities, has RLE hinge brace and ACE wrap, able to wiggle toes on right foot PSYCHIATRIC: Alert and oriented X 3. Affect and behavior are appropriate   LABS/RADIOLOGY: Labs reviewed: Basic Metabolic Panel:  Recent Labs  02/24/16 0536 02/25/16 0133 02/26/16 0407  NA 137 136 139  K 3.3* 3.3* 3.5  CL 106 104 103  CO2 '26 27 27  '$ GLUCOSE 153* 144* 161*  BUN 5* 7 10  CREATININE 0.88  0.91 0.90  CALCIUM 7.9* 7.9* 8.4*   CBC:  Recent Labs  02/25/16 1109 02/25/16 1754 02/26/16 0407  WBC 10.3 11.6* 10.3  HGB 7.6* 8.4* 8.1*  HCT 22.7* 25.3* 23.9*  MCV 95.4 93.7 93.4  PLT 137* 164 179   CBG:  Recent Labs  02/25/16 2119 02/26/16 0646 02/26/16 1128  GLUCAP 161* 183* 196*      Ct Knee Right Wo Contrast  Result Date: 02/21/2016 CLINICAL DATA:  Fall on ice today.  Evaluate distal femur fracture. EXAM: CT OF THE RIGHT KNEE WITHOUT CONTRAST TECHNIQUE: Multidetector CT imaging of the right knee was performed according to the standard protocol. Multiplanar CT image reconstructions were also generated. COMPARISON:  Radiographs same date. FINDINGS: Bones/Joint/Cartilage Again demonstrated is a comminuted, impacted and significantly displaced fracture of  the distal right femoral diaphysis. This fracture demonstrates up to 3.9 cm of lateral displacement and up to 3.1 cm of impaction. There is also a lateral rotary component. There is distal intercondylar extension of the fracture in the sagittal plane, extending into the intercondylar notch. This component is only mildly displaced but does involve the articular surface of the medial trochlea. There is no involvement of the weight-bearing articular surface of the femoral condyle. The proximal tibia, proximal fibula and patella are intact. There is a small lipohemarthrosis. No large intra-articular fracture fragments are seen. Ligaments Suboptimally evaluated by CT.  The cruciate ligaments appear intact. Muscles and Tendons The extensor mechanism is intact. No large intramuscular hematoma identified. Soft tissues There is no soft tissue emphysema or foreign body. Moderate femoral popliteal atherosclerosis noted. No evidence of acute vascular injury. IMPRESSION: 1. Comminuted and significantly displaced fracture of the distal femora as described. As discussed, this fracture demonstrates distal intercondylar extension with involvement of  the medial trochlea. There is no involvement of the weight-bearing articular surface. 2. Associated lipohemarthrosis. 3. No dislocation or tibial fracture. Electronically Signed   By: Carey Bullocks M.D.   On: 02/21/2016 11:15   Dg Chest Port 1 View  Result Date: 02/21/2016 CLINICAL DATA:  Preoperative evaluation, history hypertension, diabetes mellitus, prostate cancer EXAM: PORTABLE CHEST 1 VIEW COMPARISON:  Portable exam 0840 hours compared 09/04/2005 FINDINGS: Normal heart size, mediastinal contours, and pulmonary vascularity. Atherosclerotic calcification aorta. Lungs clear. No pleural effusion or pneumothorax. Bones unremarkable. IMPRESSION: No acute abnormalities. Aortic atherosclerosis. Electronically Signed   By: Ulyses Southward M.D.   On: 02/21/2016 08:57   Dg Knee Right Port  Result Date: 02/21/2016 CLINICAL DATA:  Knee injury EXAM: PORTABLE RIGHT KNEE - 1-2 VIEW COMPARISON:  Portable exam 0838 hours without priors for comparison FINDINGS: Osseous demineralization. Comminuted oblique fracture of the distal RIGHT femoral metadiaphysis with posterior and lateral displacement as well as overriding and mild apex medial angulation. Additionally, a longitudinal fracture plane is identified within the distal femoral metaphyseal fragment extending intra-articular at the intercondylar notch. Posteriorly displaced bone fragment at the fracture site. Patella, tibia, and fibula appear intact. No additional fracture or dislocation. Associated soft tissue deformity and swelling. Atherosclerotic calcifications of distal superficial femoral and popliteal arteries extending into trifurcation vessels. IMPRESSION: Comminuted displaced oblique distal RIGHT femoral metadiaphyseal fracture with intra-articular extension of a longitudinal fracture plane into the intercondylar notch at the RIGHT knee. Electronically Signed   By: Ulyses Southward M.D.   On: 02/21/2016 08:52   Dg C-arm Gt 120 Min  Result Date:  02/22/2016 CLINICAL DATA:  ORIF right distal femur fracture EXAM: RIGHT FEMUR 2 VIEWS; DG C-ARM GT 120 MIN COMPARISON:  02/21/2016 right femur radiograph FINDINGS: Fluoroscopy time 6 minutes 36 seconds. Multiple nondiagnostic spot fluoroscopic intraoperative radiographs of the right femur demonstrate postsurgical changes from ORIF comminuted distal right femur fracture, with transfixation of the fracture by a lateral surgical plate with multiple interlocking screws. IMPRESSION: Intraoperative fluoroscopic guidance for ORIF comminuted right distal femur fracture. Electronically Signed   By: Delbert Phenix M.D.   On: 02/22/2016 11:51   Dg Femur, Min 2 Views Right  Result Date: 02/22/2016 CLINICAL DATA:  ORIF right distal femur fracture EXAM: RIGHT FEMUR 2 VIEWS; DG C-ARM GT 120 MIN COMPARISON:  02/21/2016 right femur radiograph FINDINGS: Fluoroscopy time 6 minutes 36 seconds. Multiple nondiagnostic spot fluoroscopic intraoperative radiographs of the right femur demonstrate postsurgical changes from ORIF comminuted distal right femur fracture, with transfixation of  the fracture by a lateral surgical plate with multiple interlocking screws. IMPRESSION: Intraoperative fluoroscopic guidance for ORIF comminuted right distal femur fracture. Electronically Signed   By: Ilona Sorrel M.D.   On: 02/22/2016 11:51   Dg Femur, Min 2 Views Right  Result Date: 02/21/2016 CLINICAL DATA:  Pain following fall EXAM: RIGHT FEMUR 2 VIEWS COMPARISON:  None. FINDINGS: Frontal and lateral views were obtained. There is a comminuted fracture of the distal femoral diaphysis with posterior displacement of the distal major fracture fragment with respect to the proximal fragment. There is a approximately 5 cm of overriding of fracture fragments in this area. There is a joint effusion. No other fractures. No dislocations. There is extensive arterial vascular calcification. IMPRESSION: Comminuted displaced fracture distal femoral diaphysis.  Joint effusion. No dislocation. Extensive atherosclerotic vascular calcification. Electronically Signed   By: Lowella Grip III M.D.   On: 02/21/2016 10:26   Dg Femur Port, Min 2 Views Right  Result Date: 02/22/2016 CLINICAL DATA:  Postop right femur fracture repair EXAM: RIGHT FEMUR PORTABLE 2 VIEW COMPARISON:  None. FINDINGS: Severely comminuted distal right femoral diaphysis and metaphysis fracture transfixed with a lateral collateral sideplate with multiple interlocking screws. Fracture is in near anatomic alignment. No hardware failure complication. Postsurgical changes in the soft tissues adjacent to the right femur. Peripheral vascular atherosclerotic disease. IMPRESSION: Interval ORIF right distal femoral comminuted fracture. Electronically Signed   By: Kathreen Devoid   On: 02/22/2016 14:37    ASSESSMENT/PLAN:  Unsteady gait - for rehabilitation, PT and OT, for therapeutic strengthening exercises; fall precautions  Right femoral fracture -  S/P  ORIF,  Follow-up with orthopedic surgeon,  Dr. Erlinda Hong,  In 2 weeks, non-weight-bearing to right lower extremity,continue Lovenox 40 mg subcutaneous daily for DVT prophylaxis; Percocet 5/325 mg 1-2 tabs by mouth every 4 hours when necessary for pain; Robaxin 500 mg 1 tab by mouth every 6 hours when necessary for muscle spasm  Acute blood loss anemia -  S/Ptransfusion of 1 unit packed RBC, continue ferrous sulfate EC 325 mg 1 tab by mouth twice a day, check CBC  GERD -  Continue Protonix 40 mg 1 tab by mouth daily  Constipation -   Continue senna S1 tab by mouth twice a day and MiraLAX 17 G by Mouth Daily  Diabetes mellitus, type II -  Continue Onglyza 5 mg 1 tab by mouth daily and ACTOplus met XR  30-1000 mg 1 Tab by Mouth Daily, CBG Daily  Hypertension -  Well Controlled; continue Hydralazine 25 Mg 1 by mouth 3 Times A day and Caduet  10-20 Mg 1 Tab by Mouth Daily; Check BMP   Urinary Retention -  Recently Discontinued Foley Catheter,Monitor  Urine Output     Goals of care:  Short-term rehabilitation    Raford Brissett C. Benson - NP    [employer] [phone]

## 2016-03-02 ENCOUNTER — Non-Acute Institutional Stay (SKILLED_NURSING_FACILITY): Payer: Medicare Other | Admitting: Internal Medicine

## 2016-03-02 ENCOUNTER — Encounter: Payer: Self-pay | Admitting: Internal Medicine

## 2016-03-02 DIAGNOSIS — K5900 Constipation, unspecified: Secondary | ICD-10-CM

## 2016-03-02 DIAGNOSIS — E785 Hyperlipidemia, unspecified: Secondary | ICD-10-CM

## 2016-03-02 DIAGNOSIS — K219 Gastro-esophageal reflux disease without esophagitis: Secondary | ICD-10-CM | POA: Diagnosis not present

## 2016-03-02 DIAGNOSIS — S72461S Displaced supracondylar fracture with intracondylar extension of lower end of right femur, sequela: Secondary | ICD-10-CM

## 2016-03-02 DIAGNOSIS — E118 Type 2 diabetes mellitus with unspecified complications: Secondary | ICD-10-CM

## 2016-03-02 DIAGNOSIS — R2681 Unsteadiness on feet: Secondary | ICD-10-CM | POA: Diagnosis not present

## 2016-03-02 DIAGNOSIS — D500 Iron deficiency anemia secondary to blood loss (chronic): Secondary | ICD-10-CM

## 2016-03-02 DIAGNOSIS — I10 Essential (primary) hypertension: Secondary | ICD-10-CM | POA: Diagnosis not present

## 2016-03-02 DIAGNOSIS — E1169 Type 2 diabetes mellitus with other specified complication: Secondary | ICD-10-CM

## 2016-03-02 LAB — CBC AND DIFFERENTIAL
HCT: 26 % — AB (ref 41–53)
HEMOGLOBIN: 8.5 g/dL — AB (ref 13.5–17.5)
Neutrophils Absolute: 8 /uL
PLATELETS: 402 10*3/uL — AB (ref 150–399)
WBC: 10.8 10^3/mL

## 2016-03-02 LAB — BASIC METABOLIC PANEL
BUN: 10 mg/dL (ref 4–21)
CREATININE: 0.9 mg/dL (ref 0.6–1.3)
Glucose: 174 mg/dL
POTASSIUM: 4 mmol/L (ref 3.4–5.3)
SODIUM: 140 mmol/L (ref 137–147)

## 2016-03-02 NOTE — Progress Notes (Signed)
LOCATION: [facility]  PCP: Kandice Hams, MD   Code Status: Full Code  Goals of care: Advanced Directive information Advanced Directives 02/24/2016  Does Patient Have a Medical Advance Directive? No  Would patient like information on creating a medical advance directive? No - Patient declined  Pre-existing out of facility DNR order (yellow form or pink MOST form) -       Extended Emergency Contact Information Primary Emergency Contact: Villagran,[name] Address: [address]          [place] [address] [place] Phone: [phone] Mobile Phone: [phone] Relation: Spouse Secondary Emergency Contact: Genella Mech [place] Mobile Phone: [phone] Relation: Daughter   Allergies  Allergen Reactions  . Penicillins Other (See Comments)    Unknown-reaction years ago    Chief Complaint  Patient presents with  . New Admit To SNF    New Admission Visit      HPI:  Patient is a 71 y.o. male seen today for short term rehabilitation post hospital admission from 02/21/2016-02/26/16 post fall with right comminuted displaced oblique distal right femoral metadiaphyseal fracture.orthopedic surgery was consulted and he underwent open reduction and internal fixation on 02/22/2016. Post surgery, he had acute blood loss anemia and received 1 unit packed red blood cell transfusion. He is seen in his room today with his wife at bedside. He has medical history of hypertension, diabetes mellitus, hyperlipidemia, and GERD among others.  Review of Systems:  Constitutional: Negative for fever, chills, diaphoresis.  HENT: Negative for headache, congestion, nasal discharge, sore throat, difficulty swallowing.   Eyes: Negative for eye pain, blurred vision, double vision and discharge.  Respiratory: Negative for cough, shortness of breath and wheezing.   Cardiovascular: Negative for chest pain, palpitations, leg swelling.  Gastrointestinal:  Negative for heartburn, nausea, vomiting, abdominal pain, loss of appetite, melena. Last bowel movement was Saturday with some strainihad bowel movement every 2 days at home. Genitourinary: Negative for dysuria and flank pain.  Musculoskeletal: Negative for back pain, fall.  pain medication has been helpful. Pain currently under control.  Skin: Negative for itching, rash.  Neurological: Negative for dizziness. Psychiatric/Behavioral: Negative for depression, anxiety, insomnia.   Past Medical History:  Diagnosis Date  . Allergy    pcn  . Back pain   . BPH with obstruction/lower urinary tract symptoms   . Diabetes mellitus   . Dysrhythmia   . ED (erectile dysfunction)   . GERD (gastroesophageal reflux disease)   . Hypercholesterolemia   . Hypertension   . Prostate cancer ([place]) 01/07/12   Adenocarcinoma, gleason 4+3=7,& 3+3=6,& 3+4=7,PSA=6.08,Volume=18.95cc  . Radiation 04/07/12-06/03/12   7800 cGy,seminal vesicles 5600 cGy  . Reflux   . Shortness of breath    Past Surgical History:  Procedure Laterality Date  . CIRCUMCISION    . ESOPHAGOGASTRODUODENOSCOPY  09/11/2011   Procedure: ESOPHAGOGASTRODUODENOSCOPY (EGD);  Surgeon: [name], MD;  Location[facility] ENDOSCOPY;  Service: Endoscopy;  Laterality: N/A;  . ORIF FEMUR FRACTURE Right 02/22/2016   Procedure: OPEN REDUCTION INTERNAL FIXATION (ORIF) DISTAL FEMUR FRACTURE;  Surgeon: [name], MD;  Location: [facility];  Service: Orthopedics;  Laterality: Right;   Social History:   reports that he has never smoked. He has never used smokeless tobacco. He reports that he does not drink alcohol or use drugs.  Family History  Problem Relation Age of Onset  . Diabetes Mother   . Cancer Brother     prostate  . Cancer Brother  prostate    Medications: Allergies as of 03/02/2016      Reactions   Penicillins Other (See Comments)   Unknown-reaction years ago      Medication List       Accurate as of 03/02/16  8:53 PM. Always use your  most recent med list.          ACTOPLUS MET XR 30-1000 MG Tb24 Generic drug:  Pioglitazone HCl-Metformin HCl Take 1 tablet by mouth daily with supper.   amlodipine-atorvastatin 10-20 MG tablet Commonly known as:  CADUET Take 1 tablet by mouth daily.   aspirin 81 MG tablet Take 81 mg by mouth once a week. On Thursdays   enoxaparin 40 MG/0.4ML injection Commonly known as:  LOVENOX Inject 0.4 mLs (40 mg total) into the skin daily.   ferrous sulfate 325 (65 FE) MG EC tablet Take 325 mg by mouth 2 (two) times daily.   folic acid 1 MG tablet Commonly known as:  FOLVITE Take 1 tablet (1 mg total) by mouth daily.   hydrALAZINE 25 MG tablet Commonly known as:  APRESOLINE Take 1 tablet (25 mg total) by mouth every 8 (eight) hours.   methocarbamol 500 MG tablet Commonly known as:  ROBAXIN Take 1 tablet (500 mg total) by mouth every 6 (six) hours as needed for muscle spasms.   multivitamin with minerals Tabs tablet Take 1 tablet by mouth every morning.   ONGLYZA 5 MG Tabs tablet Generic drug:  saxagliptin HCl Take 5 mg by mouth daily.   oxyCODONE-acetaminophen 5-325 MG tablet Commonly known as:  PERCOCET Take 1-2 tablets by mouth every 4 (four) hours as needed for severe pain.   pantoprazole 40 MG tablet Commonly known as:  PROTONIX Take 1 tablet (40 mg total) by mouth daily.   polyethylene glycol packet Commonly known as:  MIRALAX / GLYCOLAX Take 17 g by mouth daily.   senna-docusate 8.6-50 MG tablet Commonly known as:  Senokot-S Take 1 tablet by mouth 2 (two) times daily.   vitamin C 500 MG tablet Commonly known as:  ASCORBIC ACID Take 500 mg by mouth daily.       Immunizations:  There is no immunization history on file for this patient.   Physical Exam: Vitals:   03/02/16 1442  BP: 122/84  Pulse: 86  Resp: 17  Temp: 99.3 F (37.4 C)  TempSrc: Oral  SpO2: 94%  Weight: 212 lb (96.2 kg)  Height: _0  (1.803 m)   Body mass index is 29.57  kg/m.  General- elderly male, well built, in no acute distress Head- normocephalic, atraumatic Nose- no maxillary or frontal sinus tenderness, no nasal discharge Throat- moist mucus membrane Eyes- PERRLA, EOMI, no pallor, no icterus, no discharge, normal conjunctiva, normal sclera Neck- no cervical lymphadenopathy Cardiovascular- normal s1,s2, no murmur Respiratory- bilateral clear to auscultation, no wheeze, no rhonchi, no crackles, no use of accessory muscles Abdomen- bowel sounds present, soft, non tender, no guarding or rigidity, no CVA tenderness Musculoskeletal- able to move all 4 extremities, limited movement of right leg, Ace wrap around the right leg with immobilizer in place, able to move his toes in the right foot, circulation intact Neurological- alert and oriented to person, place and time Skin- warm and dry Psychiatry- normal mood and affect    Labs reviewed: Basic Metabolic Panel:  Recent Labs  02/24/16 0536 02/25/16 0133 02/26/16 0407  NA 137 136 139  K 3.3* 3.3* 3.5  CL 106 104 103  CO2 26 27 27  GLUCOSE 153* 144* 161*  BUN 5* 7 10  CREATININE 0.88 0.91 0.90  CALCIUM 7.9* 7.9* 8.4*   Liver Function Tests: No results for input(s): AST, ALT, ALKPHOS, BILITOT, PROT, ALBUMIN in the last 8760 hours. No results for input(s): LIPASE, AMYLASE in the last 8760 hours. No results for input(s): AMMONIA in the last 8760 hours. CBC:  Recent Labs  02/25/16 1109 02/25/16 1754 02/26/16 0407  WBC 10.3 11.6* 10.3  HGB 7.6* 8.4* 8.1*  HCT 22.7* 25.3* 23.9*  MCV 95.4 93.7 93.4  PLT 137* 164 179   Cardiac Enzymes: No results for input(s): CKTOTAL, CKMB, CKMBINDEX, TROPONINI in the last 8760 hours. BNP: Invalid input(s): POCBNP CBG:  Recent Labs  02/25/16 2119 02/26/16 0646 02/26/16 1128  GLUCAP 161* 183* 196*    Radiological Exams: Ct Knee Right Wo Contrast  Result Date: 02/21/2016 CLINICAL DATA:  Fall on ice today.  Evaluate distal femur fracture.  EXAM: CT OF THE RIGHT KNEE WITHOUT CONTRAST TECHNIQUE: Multidetector CT imaging of the right knee was performed according to the standard protocol. Multiplanar CT image reconstructions were also generated. COMPARISON:  Radiographs same date. FINDINGS: Bones/Joint/Cartilage Again demonstrated is a comminuted, impacted and significantly displaced fracture of the distal right femoral diaphysis. This fracture demonstrates up to 3.9 cm of lateral displacement and up to 3.1 cm of impaction. There is also a lateral rotary component. There is distal intercondylar extension of the fracture in the sagittal plane, extending into the intercondylar notch. This component is only mildly displaced but does involve the articular surface of the medial trochlea. There is no involvement of the weight-bearing articular surface of the femoral condyle. The proximal tibia, proximal fibula and patella are intact. There is a small lipohemarthrosis. No large intra-articular fracture fragments are seen. Ligaments Suboptimally evaluated by CT.  The cruciate ligaments appear intact. Muscles and Tendons The extensor mechanism is intact. No large intramuscular hematoma identified. Soft tissues There is no soft tissue emphysema or foreign body. Moderate femoral popliteal atherosclerosis noted. No evidence of acute vascular injury. IMPRESSION: 1. Comminuted and significantly displaced fracture of the distal femora as described. As discussed, this fracture demonstrates distal intercondylar extension with involvement of the medial trochlea. There is no involvement of the weight-bearing articular surface. 2. Associated lipohemarthrosis. 3. No dislocation or tibial fracture. Electronically Signed   By: Richardean Sale M.D.   On: 02/21/2016 11:15   Dg Chest Port 1 View  Result Date: 02/21/2016 CLINICAL DATA:  Preoperative evaluation, history hypertension, diabetes mellitus, prostate cancer EXAM: PORTABLE CHEST 1 VIEW COMPARISON:  Portable exam 0840  hours compared 09/04/2005 FINDINGS: Normal heart size, mediastinal contours, and pulmonary vascularity. Atherosclerotic calcification aorta. Lungs clear. No pleural effusion or pneumothorax. Bones unremarkable. IMPRESSION: No acute abnormalities. Aortic atherosclerosis. Electronically Signed   By: Lavonia Dana M.D.   On: 02/21/2016 08:57   Dg Knee Right Port  Result Date: 02/21/2016 CLINICAL DATA:  Knee injury EXAM: PORTABLE RIGHT KNEE - 1-2 VIEW COMPARISON:  Portable exam 0838 hours without priors for comparison FINDINGS: Osseous demineralization. Comminuted oblique fracture of the distal RIGHT femoral metadiaphysis with posterior and lateral displacement as well as overriding and mild apex medial angulation. Additionally, a longitudinal fracture plane is identified within the distal femoral metaphyseal fragment extending intra-articular at the intercondylar notch. Posteriorly displaced bone fragment at the fracture site. Patella, tibia, and fibula appear intact. No additional fracture or dislocation. Associated soft tissue deformity and swelling. Atherosclerotic calcifications of distal superficial femoral and popliteal arteries extending into trifurcation  vessels. IMPRESSION: Comminuted displaced oblique distal RIGHT femoral metadiaphyseal fracture with intra-articular extension of a longitudinal fracture plane into the intercondylar notch at the RIGHT knee. Electronically Signed   By: Lavonia Dana M.D.   On: 02/21/2016 08:52   Dg C-arm Gt 120 Min  Result Date: 02/22/2016 CLINICAL DATA:  ORIF right distal femur fracture EXAM: RIGHT FEMUR 2 VIEWS; DG C-ARM GT 120 MIN COMPARISON:  02/21/2016 right femur radiograph FINDINGS: Fluoroscopy time 6 minutes 36 seconds. Multiple nondiagnostic spot fluoroscopic intraoperative radiographs of the right femur demonstrate postsurgical changes from ORIF comminuted distal right femur fracture, with transfixation of the fracture by a lateral surgical plate with multiple  interlocking screws. IMPRESSION: Intraoperative fluoroscopic guidance for ORIF comminuted right distal femur fracture. Electronically Signed   By: Ilona Sorrel M.D.   On: 02/22/2016 11:51   Dg Femur, Min 2 Views Right  Result Date: 02/22/2016 CLINICAL DATA:  ORIF right distal femur fracture EXAM: RIGHT FEMUR 2 VIEWS; DG C-ARM GT 120 MIN COMPARISON:  02/21/2016 right femur radiograph FINDINGS: Fluoroscopy time 6 minutes 36 seconds. Multiple nondiagnostic spot fluoroscopic intraoperative radiographs of the right femur demonstrate postsurgical changes from ORIF comminuted distal right femur fracture, with transfixation of the fracture by a lateral surgical plate with multiple interlocking screws. IMPRESSION: Intraoperative fluoroscopic guidance for ORIF comminuted right distal femur fracture. Electronically Signed   By: Ilona Sorrel M.D.   On: 02/22/2016 11:51   Dg Femur, Min 2 Views Right  Result Date: 02/21/2016 CLINICAL DATA:  Pain following fall EXAM: RIGHT FEMUR 2 VIEWS COMPARISON:  None. FINDINGS: Frontal and lateral views were obtained. There is a comminuted fracture of the distal femoral diaphysis with posterior displacement of the distal major fracture fragment with respect to the proximal fragment. There is a approximately 5 cm of overriding of fracture fragments in this area. There is a joint effusion. No other fractures. No dislocations. There is extensive arterial vascular calcification. IMPRESSION: Comminuted displaced fracture distal femoral diaphysis. Joint effusion. No dislocation. Extensive atherosclerotic vascular calcification. Electronically Signed   By: Lowella Grip III M.D.   On: 02/21/2016 10:26   Dg Femur Port, Min 2 Views Right  Result Date: 02/22/2016 CLINICAL DATA:  Postop right femur fracture repair EXAM: RIGHT FEMUR PORTABLE 2 VIEW COMPARISON:  None. FINDINGS: Severely comminuted distal right femoral diaphysis and metaphysis fracture transfixed with a lateral collateral  sideplate with multiple interlocking screws. Fracture is in near anatomic alignment. No hardware failure complication. Postsurgical changes in the soft tissues adjacent to the right femur. Peripheral vascular atherosclerotic disease. IMPRESSION: Interval ORIF right distal femoral comminuted fracture. Electronically Signed   By: Kathreen Devoid   On: 02/22/2016 14:37    Assessment/Plan  Unsteady gait Post fall with fracture. Nonweightbearing to right lower extremity for now. Will need to work with physical therapy and occupational therapy team to help regain his strength and restore his balance. Fall precautions to be taken.  Right femoral fracture Status post ORIF. Will need follow-up with orthopedic. Continue Percocet 5-325 milligrams 1-2 tablet every 4 hours as needed for pain and Robaxin 500 mg every 6 hours as needed for muscle spasm. Continue Lovenox injection on daily basis for DVT prophylaxis.Will have patient work with PT/OT as tolerated to regain strength and restore function.  Fall precautions are in place.  blood loss anemia Postoperative, status post 1 unit packed red blood cell transfusion, check CBC. Continue ferrous sulfate 325 mg twice a day  Constipation MiraLAX daily and Senokot-S 1 tablet twice  a day, encouraged hydration  Type 2 diabetes mellitus Currently on Onglyza 5 mg daily, 30 mg and metformin thousand milligrams dailys. Monitor CBG. Continue statin Lab Results  Component Value Date   HGBA1C 5.6 02/21/2016   Hypertension Monitor blood pressure reading. Continue amlodipine 10 mg daily And hydralazine 25 mg 3 times a day.  Hyperlipidemia Continue atorvastatin no changes made   GERD Staple symptoms on pantoprazole, no changes made    Goals of care: short term rehabilitation   Labs/tests ordered: CBC, BMP   Family/ staff Communication: reviewed care plan with patient, wife and nursing supervisor    Blanchie Serve, MD Internal Medicine [place], [place] [address] Cell Phone (Monday-Friday 8 am - 5 pm): [phone] On Call: [phone] and follow prompts after 5 pm and on weekends Office Phone: [phone] Office Fax: [phone]

## 2016-03-05 ENCOUNTER — Ambulatory Visit (INDEPENDENT_AMBULATORY_CARE_PROVIDER_SITE_OTHER): Payer: Medicare Other

## 2016-03-05 ENCOUNTER — Encounter (INDEPENDENT_AMBULATORY_CARE_PROVIDER_SITE_OTHER): Payer: Self-pay | Admitting: Orthopaedic Surgery

## 2016-03-05 ENCOUNTER — Ambulatory Visit (INDEPENDENT_AMBULATORY_CARE_PROVIDER_SITE_OTHER): Payer: Medicare Other | Admitting: Orthopaedic Surgery

## 2016-03-05 DIAGNOSIS — S72461S Displaced supracondylar fracture with intracondylar extension of lower end of right femur, sequela: Secondary | ICD-10-CM

## 2016-03-05 NOTE — Progress Notes (Signed)
Patient is 2 weeks status post operative fixation of supracondylar femur fracture. He is currently at a skilled nursing facility. He is doing well without real complaints. His surgical scars have all healed up. The external fixator pin site is oozing slightly serosanguineous drainage. There is no signs of infection. His x-ray show stable fixation.  Continue nonweightbearing for at least 4 more weeks. Bledsoe brace when out of bed. Up with physical therapy for mobilization of his knee joint.Follow-up in 4 weeks with repeat 2 view xrays right femur.

## 2016-03-11 LAB — CBC AND DIFFERENTIAL
HCT: 32 % — AB (ref 41–53)
HEMOGLOBIN: 9.9 g/dL — AB (ref 13.5–17.5)
Platelets: 477 10*3/uL — AB (ref 150–399)
WBC: 8.6 10*3/mL

## 2016-03-13 ENCOUNTER — Non-Acute Institutional Stay (SKILLED_NURSING_FACILITY): Payer: Medicare Other | Admitting: Adult Health

## 2016-03-13 ENCOUNTER — Encounter: Payer: Self-pay | Admitting: Adult Health

## 2016-03-13 DIAGNOSIS — R2681 Unsteadiness on feet: Secondary | ICD-10-CM | POA: Diagnosis not present

## 2016-03-13 DIAGNOSIS — I10 Essential (primary) hypertension: Secondary | ICD-10-CM

## 2016-03-13 DIAGNOSIS — E118 Type 2 diabetes mellitus with unspecified complications: Secondary | ICD-10-CM | POA: Diagnosis not present

## 2016-03-13 DIAGNOSIS — S72461S Displaced supracondylar fracture with intracondylar extension of lower end of right femur, sequela: Secondary | ICD-10-CM | POA: Diagnosis not present

## 2016-03-13 DIAGNOSIS — D500 Iron deficiency anemia secondary to blood loss (chronic): Secondary | ICD-10-CM

## 2016-03-13 DIAGNOSIS — K5901 Slow transit constipation: Secondary | ICD-10-CM

## 2016-03-13 DIAGNOSIS — R339 Retention of urine, unspecified: Secondary | ICD-10-CM | POA: Diagnosis not present

## 2016-03-13 DIAGNOSIS — K219 Gastro-esophageal reflux disease without esophagitis: Secondary | ICD-10-CM

## 2016-03-13 NOTE — Progress Notes (Signed)
Patient ID: Christian Estrada, male   DOB: 03-26-1945, 71 y.o.   MRN: [id number]    DATE:  03/13/2016   MRN:  [id number]  BIRTHDAY: 04/26/1945  Facility:  Nursing Home Location:  [place] Room Number: 903-A  LEVEL OF CARE:  SNF [phone])  Contact Information    Name Relation Home Work Mobile   [place] Spouse [phone]  [phone]   Truth, Barot Daughter   [phone]       Code Status History    Date Active Date Inactive Code Status Order ID Comments User Context   02/21/2016 10:03 AM 02/26/2016  8:02 PM Full Code [id number]  Radene Gunning, NP ED   09/10/2011  9:18 PM 09/12/2011 12:49 PM Full Code [id number]  Earleen Reaper, RN Inpatient       Chief Complaint  Patient presents with  . Discharge Note    HISTORY OF PRESENT ILLNESS:  This is a 71-YO male who is for discharge home with Home health PT, OT, CNA and Nursing.  He was admitted to [place] on 02/26/2016 for short-term rehabilitation following an admission at [facility] 02/21/2016-02/26/2016 for a slip and fall on ice with subsequent right knee pain.  Evaluation revealed a comminuted displaced oblique distal right femoral metadiaphyseal fracture and he underwent ORIF 02/22/2016. He had acute blood loss anemia post-op and was transfused 1 unit PRBC. He has PMH of hypertension, diabetes mellitus, hyperlipidemia and GERD.  Patient was admitted to this facility for short-term rehabilitation after the patient's recent hospitalization.  Patient has completed SNF rehabilitation and therapy has cleared the patient for discharge.   PAST MEDICAL HISTORY:  Past Medical History:  Diagnosis Date  . Allergy    pcn  . Back pain   . BPH with obstruction/lower urinary tract symptoms   . Diabetes mellitus   . Dysrhythmia   . ED (erectile dysfunction)   . GERD (gastroesophageal reflux disease)   . Hypercholesterolemia   . Hypertension   . Prostate cancer ([place]) 01/07/12    Adenocarcinoma, gleason 4+3=7,& 3+3=6,& 3+4=7,PSA=6.08,Volume=18.95cc  . Radiation 04/07/12-06/03/12   7800 cGy,seminal vesicles 5600 cGy  . Reflux   . Shortness of breath      CURRENT MEDICATIONS: Reviewed  Patient's Medications  New Prescriptions   HYDROCODONE-ACETAMINOPHEN (NORCO) 5-325 MG TABLET    Take 1-2 tablets by mouth daily as needed.  Previous Medications   AMLODIPINE-ATORVASTATIN (CADUET) 10-20 MG PER TABLET    Take 1 tablet by mouth daily.   ASPIRIN 81 MG TABLET    Take 81 mg by mouth once a week. On Thursdays   ENOXAPARIN (LOVENOX) 40 MG/0.4ML INJECTION    Inject 0.4 mLs (40 mg total) into the skin daily.   FERROUS SULFATE 325 (65 FE) MG EC TABLET    Take 325 mg by mouth 2 (two) times daily.   FOLIC ACID (FOLVITE) 1 MG TABLET    Take 1 tablet (1 mg total) by mouth daily.   HYDRALAZINE (APRESOLINE) 25 MG TABLET    Take 1 tablet (25 mg total) by mouth every 8 (eight) hours.   METHOCARBAMOL (ROBAXIN) 500 MG TABLET    Take 1 tablet (500 mg total) by mouth every 6 (six) hours as needed for muscle spasms.   MULTIPLE VITAMIN (MULTIVITAMIN WITH MINERALS) TABS    Take 1 tablet by mouth every morning.   MUPIROCIN OINTMENT (BACTROBAN) 2 %    Place 1 application into the nose daily. Apply to right leg  ONGLYZA 5 MG TABS TABLET    Take 5 mg by mouth daily.    PANTOPRAZOLE (PROTONIX) 40 MG TABLET    Take 1 tablet (40 mg total) by mouth daily.   PIOGLITAZONE HCL-METFORMIN HCL (ACTOPLUS MET XR) 30-1000 MG TB24    Take 1 tablet by mouth daily with supper.   POLYETHYLENE GLYCOL (MIRALAX / GLYCOLAX) PACKET    Take 17 g by mouth daily.   SENNA-DOCUSATE (SENOKOT-S) 8.6-50 MG TABLET    Take 1 tablet by mouth 2 (two) times daily.   VITAMIN C (ASCORBIC ACID) 500 MG TABLET    Take 500 mg by mouth daily.  Modified Medications   Modified Medication Previous Medication   OXYCODONE-ACETAMINOPHEN (PERCOCET) 5-325 MG TABLET oxyCODONE-acetaminophen (PERCOCET) 5-325 MG tablet      Take 1 tablet by mouth  every 6 (six) hours as needed for severe pain.    Take 1-2 tablets by mouth every 4 (four) hours as needed for severe pain.  Discontinued Medications   No medications on file     Allergies  Allergen Reactions  . Penicillins Other (See Comments)    Unknown-reaction years ago     REVIEW OF SYSTEMS:  GENERAL: no change in appetite, no fatigue, no weight changes, no fever, chills or weakness EYES: Denies change in vision, dry eyes, eye pain, itching or discharge EARS: Denies change in hearing, ringing in ears, or earache NOSE: Denies nasal congestion or epistaxis MOUTH and THROAT: Denies oral discomfort, gingival pain or bleeding, pain from teeth or hoarseness   RESPIRATORY: no cough, SOB, DOE, wheezing, hemoptysis CARDIAC: no chest pain or palpitations GI: no abdominal pain, diarrhea, constipation, heart burn, nausea or vomiting GU: foley catheter was recently discontinued PSYCHIATRIC: Denies feeling of depression or anxiety. No report of hallucinations, insomnia, paranoia, or agitation   PHYSICAL EXAMINATION  GENERAL APPEARANCE: Well nourished. In no acute distress. Normal body habitus SKIN:  Right hip lateral and anterior thigh surgical incision are healed, right shin with wound, no redness HEAD: Normal in size and contour. No evidence of trauma EYES: Lids open and close normally. No blepharitis, entropion or ectropion. PERRL. Conjunctivae are clear and sclerae are white. Lenses are without opacity EARS: Pinnae are normal. Patient hears normal voice tunes of the examiner MOUTH and THROAT: Lips are without lesions. Oral mucosa is moist and without lesions. Tongue is normal in shape, size, and color and without lesions NECK: supple, trachea midline, no neck masses, no thyroid tenderness, no thyromegaly LYMPHATICS: no LAN in the neck, no supraclavicular LAN RESPIRATORY: breathing is even & unlabored, BS CTAB CARDIAC: irregular heart rate, no murmur,no extra heart sounds, right foot  2+ edema GI: abdomen soft, normal BS, no masses, no tenderness, no hepatomegaly, no splenomegaly EXTREMITIES:  Able to move X 4 extremities, has RLE with bledsoe brace and ACE wrap, able to wiggle toes on right foot PSYCHIATRIC: Alert and oriented X 3. Affect and behavior are appropriate   LABS/RADIOLOGY: Labs reviewed: Basic Metabolic Panel:  Recent Labs  02/24/16 0536 02/25/16 0133 02/26/16 0407 03/02/16  NA 137 136 139 140  K 3.3* 3.3* 3.5 4.0  CL 106 104 103  --   CO2 _0 --   GLUCOSE 153* 144* 161*  --   BUN 5* _1 CREATININE 0.88 0.91 0.90 0.9  CALCIUM 7.9* 7.9* 8.4*  --    CBC:  Recent Labs  02/25/16 1109 02/25/16 1754 02/26/16 0407 03/02/16 03/11/16  WBC 10.3 11.6* 10.3 10.8  8.6  NEUTROABS  --   --   --  8  --   HGB 7.6* 8.4* 8.1* 8.5* 9.9*  HCT 22.7* 25.3* 23.9* 26* 32*  MCV 95.4 93.7 93.4  --   --   PLT 137* 164 179 402* 477*   CBG:  Recent Labs  02/25/16 2119 02/26/16 0646 02/26/16 1128  GLUCAP 161* 183* 196*      ASSESSMENT/PLAN:  Unsteady gait - for Home health PT and OT, for therapeutic strengthening exercises; fall precautions  Right femoral fracture -  S/P  ORIF,  follow-up with orthopedic surgeon,  Dr. Erlinda Hong, non-weight-bearing to right lower extremity X 4 weeks, continue Lovenox 40 mg subcutaneous daily for DVT prophylaxis; Percocet 5/325 mg 1-2 tabs by mouth every 4 hours when necessary for pain; Robaxin 500 mg 1 tab by mouth every 6 hours when necessary for muscle spasm, continue bledsoe brace when out of bed  Acute blood loss anemia -  Stable; S/P transfusion of 1 unit packed RBC, continue ferrous sulfate EC 325 mg 1 tab by mouth twice a day  GERD -  Continue Protonix 40 mg 1 tab by mouth daily  Constipation -   Continue senna S1 tab by mouth twice a day and MiraLAX 17 G by Mouth Daily  Diabetes mellitus, type II -  Continue Onglyza 5 mg 1 tab by mouth daily and ACTOplus met XR  30-1000 mg 1 Tab by Mouth Daily, CBG  Daily  Hypertension -  Well Controlled; continue Hydralazine 25 Mg 1 by mouth 3 Times A day and Caduet  10-20 Mg 1 Tab by Mouth Daily  Urinary Retention -  Recently Discontinued Foley Catheter,Monitor Urine Output    I have filled out patient's discharge paperwork and written prescriptions.  Patient will receive home health PT, OT, Nursing and CNA.  DME provided:   Standard Wheelchair with Elevating Leg Rests, Cushion, Break Extenders and anti-tippers  Total discharge time: Greater than 30 minutes  Greater Than 50% Was Spent in Counseling and Coordination of Care.   Discharge time involved coordination of the discharge process with social worker, nursing staff and therapy department. Medical justification for home health services/DME verified.    Loan Oguin C. [place] - NP    [employer] [phone]

## 2016-03-23 ENCOUNTER — Telehealth (INDEPENDENT_AMBULATORY_CARE_PROVIDER_SITE_OTHER): Payer: Self-pay | Admitting: Orthopaedic Surgery

## 2016-03-23 NOTE — Telephone Encounter (Signed)
yes

## 2016-03-23 NOTE — Telephone Encounter (Signed)
Needing verbal order for physical therapy and ocupational therapy. 1 week 1 and 2 week 2 effective feb. 17th. CB # [phone]

## 2016-03-23 NOTE — Telephone Encounter (Signed)
Please advise 

## 2016-03-24 NOTE — Telephone Encounter (Signed)
LMOM with approval orders.

## 2016-04-02 ENCOUNTER — Ambulatory Visit (INDEPENDENT_AMBULATORY_CARE_PROVIDER_SITE_OTHER): Payer: Medicare Other

## 2016-04-02 ENCOUNTER — Ambulatory Visit (INDEPENDENT_AMBULATORY_CARE_PROVIDER_SITE_OTHER): Payer: Medicare Other | Admitting: Orthopaedic Surgery

## 2016-04-02 ENCOUNTER — Ambulatory Visit (INDEPENDENT_AMBULATORY_CARE_PROVIDER_SITE_OTHER): Payer: Medicare Other | Admitting: Orthopedic Surgery

## 2016-04-02 DIAGNOSIS — S72461S Displaced supracondylar fracture with intracondylar extension of lower end of right femur, sequela: Secondary | ICD-10-CM | POA: Diagnosis not present

## 2016-04-02 NOTE — Progress Notes (Signed)
Patient is 6 weeks status post operative fixation of right supracondylar femur fracture. He is doing great. He is ready to walk. He is not taking any pain medicines. Physical exam shows a well-healed surgical scar. His range of motion is 0-85. His x-ray show stable fixation with evidence of early bridging callus. I will see him back in 6 weeks with repeat 2 view x-rays of the right femur. In the meantime I'm going to advance him to weight-bear as tolerated and home health physical therapy for gait training and balance and strengthening. Questions encouraged and answered.

## 2016-04-14 ENCOUNTER — Telehealth (INDEPENDENT_AMBULATORY_CARE_PROVIDER_SITE_OTHER): Payer: Self-pay | Admitting: Orthopaedic Surgery

## 2016-04-14 NOTE — Telephone Encounter (Signed)
Yes #30

## 2016-04-14 NOTE — Telephone Encounter (Signed)
Please advise 

## 2016-04-14 NOTE — Telephone Encounter (Signed)
Patient calling for pain Rx for the R Knee (oxycodone). Patient only has 3 left.  Please let him know when script is ready to pick up and he can have his wife stop by.

## 2016-04-15 MED ORDER — OXYCODONE-ACETAMINOPHEN 5-325 MG PO TABS: 1.0000 | ORAL_TABLET | Freq: Four times a day (QID) | ORAL | 0 refills | Status: DC | PRN

## 2016-04-15 NOTE — Telephone Encounter (Signed)
Will be ready for pick up Thursday AM 

## 2016-04-15 NOTE — Telephone Encounter (Signed)
Rx pending Dr Phoebe Sharps signature

## 2016-04-16 NOTE — Telephone Encounter (Signed)
rx ready for pick up at the front desk called pt, pt aware

## 2016-05-14 ENCOUNTER — Encounter (INDEPENDENT_AMBULATORY_CARE_PROVIDER_SITE_OTHER): Payer: Self-pay | Admitting: Orthopaedic Surgery

## 2016-05-14 ENCOUNTER — Ambulatory Visit (INDEPENDENT_AMBULATORY_CARE_PROVIDER_SITE_OTHER): Payer: Medicare Other | Admitting: Orthopaedic Surgery

## 2016-05-14 ENCOUNTER — Ambulatory Visit (INDEPENDENT_AMBULATORY_CARE_PROVIDER_SITE_OTHER): Payer: Medicare Other

## 2016-05-14 DIAGNOSIS — S72461S Displaced supracondylar fracture with intracondylar extension of lower end of right femur, sequela: Secondary | ICD-10-CM | POA: Diagnosis not present

## 2016-05-14 MED ORDER — HYDROCODONE-ACETAMINOPHEN 5-325 MG PO TABS: 1.0000 | ORAL_TABLET | Freq: Every day | ORAL | 0 refills | Status: DC | PRN

## 2016-05-14 NOTE — Progress Notes (Signed)
Christian Estrada is a very pleasant 71 year old gentleman who is approximately 3 months status post ORIF of supracondylar femur fracture. He is ambulating with a walker at home and crutches in public. He is doing home exercises and physical therapy. He is not complaining of any significant pain. His physical exam shows a well-healed surgical scar. His range of motion is 0-90. His x-rays show evidence of bridging callus and continued healing without any complications. From my standpoint the patient is healing appropriately. I will like to continue to follow him until he is fully healed. I want to see him back in 2 months for repeat 2 view x-rays of the right femur. Questions encouraged and answered. May wean crutches and rolling walker as tolerated per physical therapy discretion.

## 2016-07-03 NOTE — Addendum Note (Signed)
Addendum  created 07/03/16 0916 by Effie Berkshire, MD   Sign clinical note

## 2016-07-21 ENCOUNTER — Ambulatory Visit (INDEPENDENT_AMBULATORY_CARE_PROVIDER_SITE_OTHER): Payer: Medicare Other

## 2016-07-21 ENCOUNTER — Encounter (INDEPENDENT_AMBULATORY_CARE_PROVIDER_SITE_OTHER): Payer: Self-pay | Admitting: Orthopaedic Surgery

## 2016-07-21 ENCOUNTER — Ambulatory Visit (INDEPENDENT_AMBULATORY_CARE_PROVIDER_SITE_OTHER): Payer: Medicare Other | Admitting: Orthopaedic Surgery

## 2016-07-21 DIAGNOSIS — S72461D Displaced supracondylar fracture with intracondylar extension of lower end of right femur, subsequent encounter for closed fracture with routine healing: Secondary | ICD-10-CM | POA: Diagnosis not present

## 2016-07-21 NOTE — Progress Notes (Signed)
Office Visit Note   Patient: Christian Estrada           Date of Birth: Oct 03, 1945           MRN: [id number] Visit Date: 07/21/2016              Requested by: [name], MD [address] [place], [place] [address] PCP: [name], MD   Assessment & Plan: Visit Diagnoses:  1. Closed displaced supracondylar fracture of distal end of right femur with intracondylar extension with routine healing, subsequent encounter     Plan: Clinically and radiographically Mr. Meech is healed his fracture. He is doing well. At this point would release him. Follow-up as needed.  Follow-Up Instructions: Return if symptoms worsen or fail to improve.   Orders:  Orders Placed This Encounter  Procedures  . XR FEMUR, MIN 2 VIEWS RIGHT   No orders of the defined types were placed in this encounter.     Procedures: No procedures performed   Clinical Data: No additional findings.   Subjective: Chief Complaint  Patient presents with  . Right Leg - Fracture, Follow-up    Patient is 5 months status post ORIF of a right supracondylar femur fracture. He is doing well and walking with a cane. He denies taking any pain medicines.    Review of Systems   Objective: Vital Signs: There were no vitals taken for this visit.  Physical Exam  Ortho Exam Surgical scar is fully healed. Range of motion 0-95. Specialty Comments:  No specialty comments available.  Imaging: Xr Femur, Min 2 Views Right  Result Date: 07/21/2016 Healed supracondylar femur fracture with abundant callus formation and stable fixation    PMFS History: Patient Active Problem List   Diagnosis Date Noted  . Postoperative anemia due to acute blood loss 02/23/2016  . Femur fracture, right ([place]) 02/21/2016  . Reflux   . Hypertension   . Back pain   . Shortness of breath   . GERD (gastroesophageal reflux disease)   . Dysrhythmia   . Hypercholesterolemia   . BPH with obstruction/lower urinary  tract symptoms   . Prostate cancer ([place]) 01/07/2012  . Anemia 09/10/2011  . Blood in stool 09/10/2011  . Diabetes mellitus ([place]) 09/10/2011  . HTN (hypertension) 09/10/2011   Past Medical History:  Diagnosis Date  . Allergy    pcn  . Back pain   . BPH with obstruction/lower urinary tract symptoms   . Diabetes mellitus   . Dysrhythmia   . ED (erectile dysfunction)   . GERD (gastroesophageal reflux disease)   . Hypercholesterolemia   . Hypertension   . Prostate cancer ([place]) 01/07/12   Adenocarcinoma, gleason 4+3=7,& 3+3=6,& 3+4=7,PSA=6.08,Volume=18.95cc  . Radiation 04/07/12-06/03/12   7800 cGy,seminal vesicles 5600 cGy  . Reflux   . Shortness of breath     Family History  Problem Relation Age of Onset  . Diabetes Mother   . Cancer Brother        prostate  . Cancer Brother        prostate    Past Surgical History:  Procedure Laterality Date  . CIRCUMCISION    . ESOPHAGOGASTRODUODENOSCOPY  09/11/2011   Procedure: ESOPHAGOGASTRODUODENOSCOPY (EGD);  Surgeon: [name], MD;  Location: [facility] ENDOSCOPY;  Service: Endoscopy;  Laterality: N/A;  . ORIF FEMUR FRACTURE Right 02/22/2016   Procedure: OPEN REDUCTION INTERNAL FIXATION (ORIF) DISTAL FEMUR FRACTURE;  Surgeon: [name], MD;  Location: [facility];  Service: Orthopedics;  Laterality: Right;   Social History   Occupational History  . RETIRED    Social History Main Topics  . Smoking status: Never Smoker  . Smokeless tobacco: Never Used  . Alcohol use No  . Drug use: No  . Sexual activity: Not on file

## 2016-07-31 ENCOUNTER — Other Ambulatory Visit: Payer: Self-pay | Admitting: Adult Health

## 2016-08-02 ENCOUNTER — Other Ambulatory Visit: Payer: Self-pay | Admitting: Adult Health

## 2016-09-21 ENCOUNTER — Telehealth (INDEPENDENT_AMBULATORY_CARE_PROVIDER_SITE_OTHER): Payer: Self-pay | Admitting: Orthopaedic Surgery

## 2016-09-21 NOTE — Telephone Encounter (Signed)
I received a VM form Alex at [name] asking for DOS 05/15/2016. I called her back [phone], and lmvm advising that patient was not seen that date.  Asked that she call me back is she needs anything else.

## 2016-09-22 ENCOUNTER — Telehealth (INDEPENDENT_AMBULATORY_CARE_PROVIDER_SITE_OTHER): Payer: Self-pay | Admitting: Orthopaedic Surgery

## 2016-09-22 NOTE — Telephone Encounter (Signed)
Patient called advised he is going to the dentist Thursday at 3:00pm. Patient stated he was told that he needed an antibiotic prior to the procedure. The number to contact patient is [phone]

## 2016-09-22 NOTE — Telephone Encounter (Signed)
He does not need abx.  He just has a plate.  No joint replacement

## 2016-09-22 NOTE — Telephone Encounter (Signed)
See message below °

## 2016-09-23 NOTE — Telephone Encounter (Signed)
Called to advise message to patient

## 2016-11-19 ENCOUNTER — Telehealth (INDEPENDENT_AMBULATORY_CARE_PROVIDER_SITE_OTHER): Payer: Self-pay | Admitting: Orthopaedic Surgery

## 2016-11-19 NOTE — Telephone Encounter (Signed)
Received VM from [facility] @ [name]. Stated she is missing DOS 1/25,2/9 & 6/19. I called her back and lmvm [phone] told her that patient was not seen 1/25 & 2/9, and that the 6/19 note was faxed on 9/26 per the dates requested.

## 2016-11-20 NOTE — Telephone Encounter (Signed)
07/21/2016 OV NOTE REFAXED TO [facility] ATTY/ATTN KATI [phone]

## 2016-12-10 ENCOUNTER — Telehealth (INDEPENDENT_AMBULATORY_CARE_PROVIDER_SITE_OTHER): Payer: Self-pay | Admitting: Orthopaedic Surgery

## 2016-12-10 NOTE — Telephone Encounter (Signed)
Christian Estrada w/ [name] called requesting records. I relayed to her the ph calls from 10/18 & 10/19,the only DOS within requested date range is 6/19 and has been faxed twice before. Their fax number verified and I faxed again [phone].

## 2017-05-25 ENCOUNTER — Telehealth (INDEPENDENT_AMBULATORY_CARE_PROVIDER_SITE_OTHER): Payer: Self-pay | Admitting: Orthopaedic Surgery

## 2017-05-25 NOTE — Telephone Encounter (Signed)
See message below. Can you approve on another one? If so, for how long?
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
## 2017-05-26 NOTE — Telephone Encounter (Signed)
Called patient no answer LMOM to advise Handicap form is ready for pick up at the front desk

## 2017-05-26 NOTE — Telephone Encounter (Signed)
3 month temporary only

## 2018-03-21 IMAGING — CR DG KNEE 1-2V PORT*R*
2 series · 2 of 2 positions shown · non-contrast
Comparison: Portable exam 4454 hours without priors for comparison

CLINICAL DATA: Knee injury

EXAM:
PORTABLE RIGHT KNEE - 1-2 VIEW

[AP]
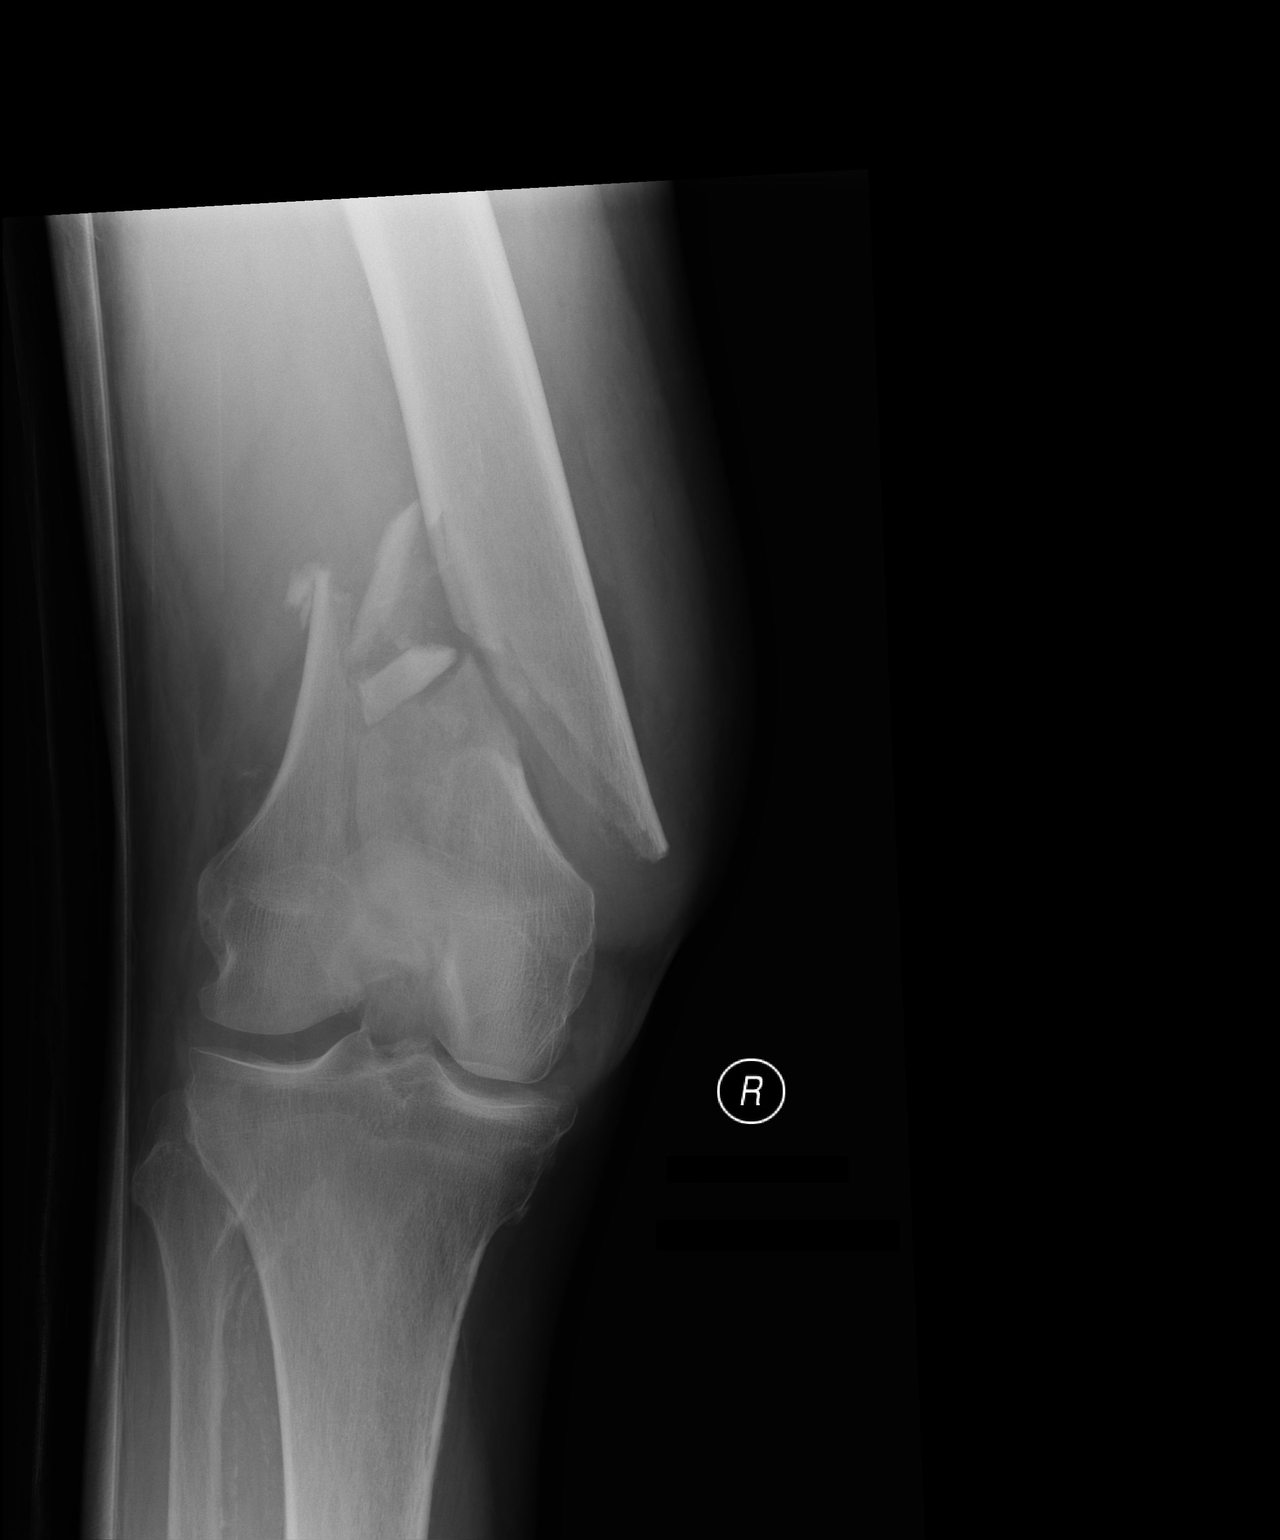

[xtable lateral]
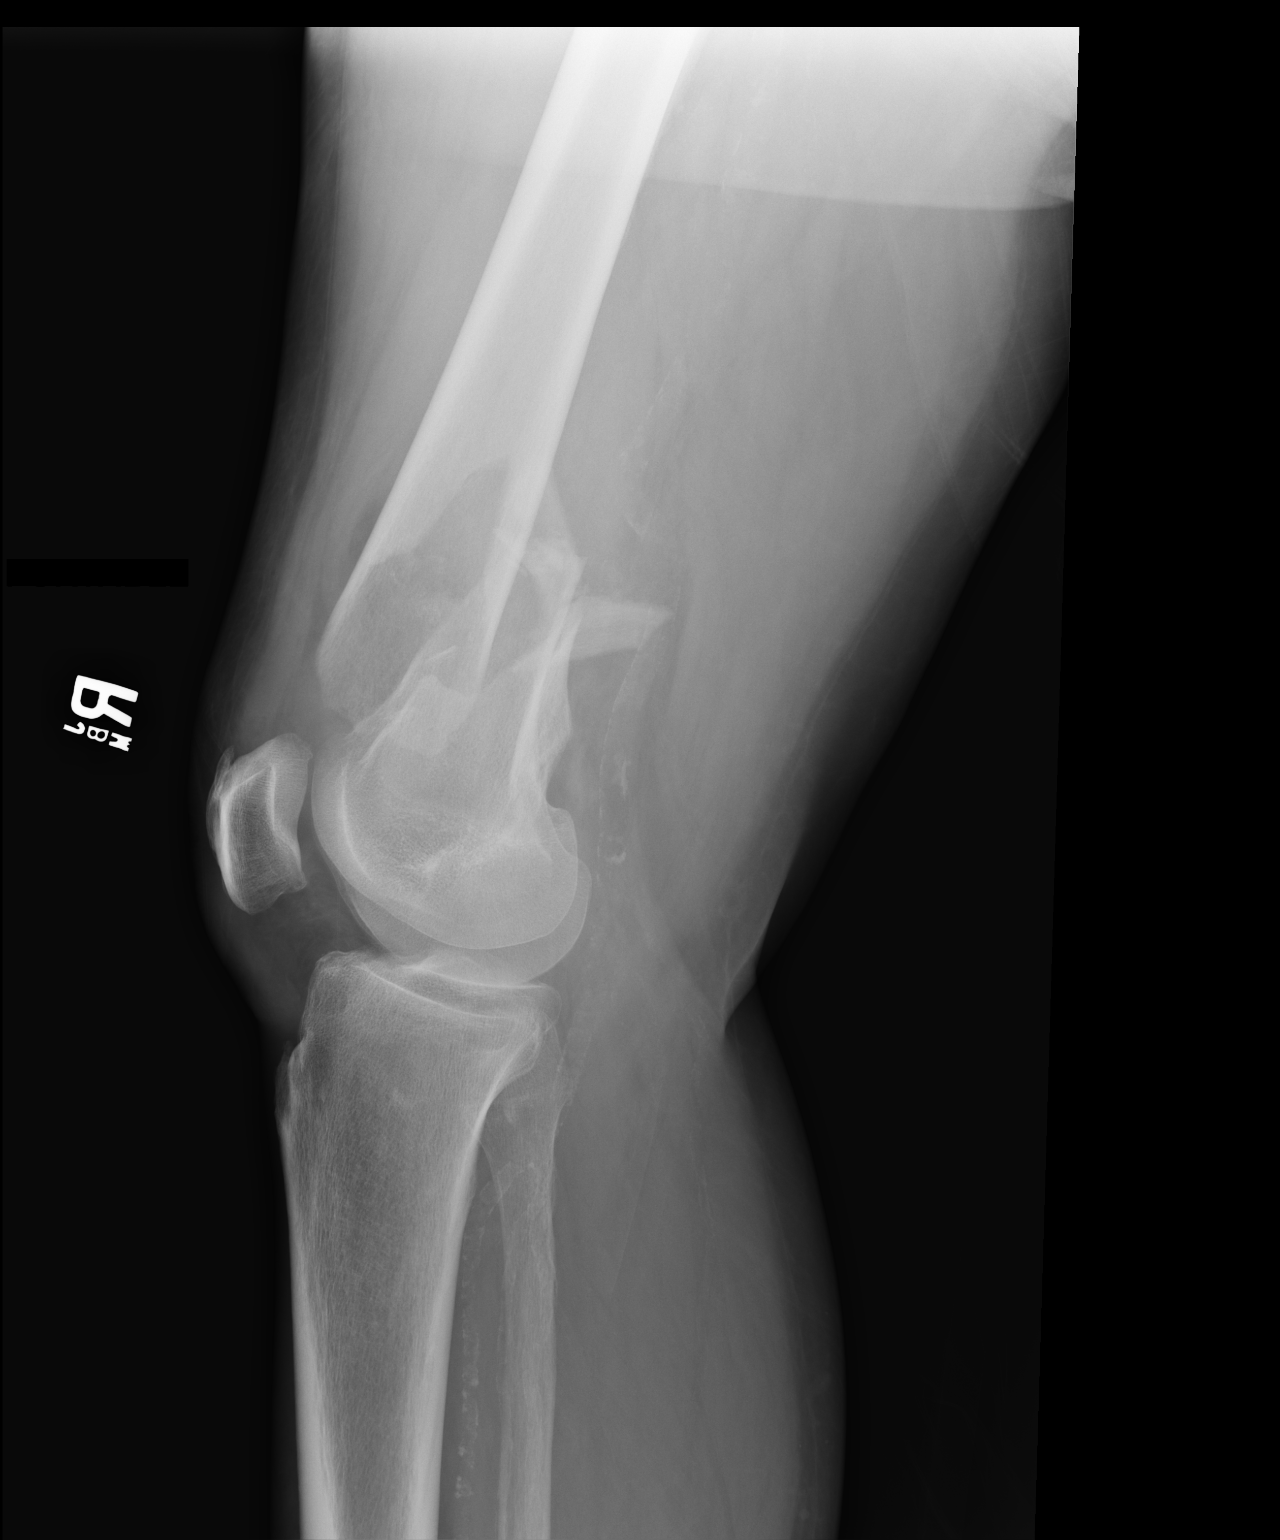

[2 of 2 positions shown; findings below may reference images not displayed]

FINDINGS: Osseous demineralization.

Comminuted oblique fracture of the distal RIGHT femoral
metadiaphysis with posterior and lateral displacement as well as
overriding and mild apex medial angulation.

Additionally, a longitudinal fracture plane is identified within the
distal femoral metaphyseal fragment extending intra-articular at the
intercondylar notch.

Posteriorly displaced bone fragment at the fracture site.

Patella, tibia, and fibula appear intact.

No additional fracture or dislocation.

Associated soft tissue deformity and swelling.

Atherosclerotic calcifications of distal superficial femoral and
popliteal arteries extending into trifurcation vessels.
IMPRESSION: Comminuted displaced oblique distal RIGHT femoral metadiaphyseal
fracture with intra-articular extension of a longitudinal fracture
plane into the intercondylar notch at the RIGHT knee.

## 2019-09-26 ENCOUNTER — Ambulatory Visit: Payer: Medicare Other | Admitting: Podiatry

## 2019-10-18 ENCOUNTER — Other Ambulatory Visit: Payer: Self-pay

## 2019-10-18 ENCOUNTER — Ambulatory Visit: Payer: Medicare Other | Admitting: Podiatry

## 2019-10-18 ENCOUNTER — Encounter: Payer: Self-pay | Admitting: Podiatry

## 2019-10-18 DIAGNOSIS — B351 Tinea unguium: Secondary | ICD-10-CM | POA: Diagnosis not present

## 2019-10-18 DIAGNOSIS — E119 Type 2 diabetes mellitus without complications: Secondary | ICD-10-CM | POA: Diagnosis not present

## 2019-10-18 DIAGNOSIS — M79675 Pain in left toe(s): Secondary | ICD-10-CM | POA: Diagnosis not present

## 2019-10-18 DIAGNOSIS — L600 Ingrowing nail: Secondary | ICD-10-CM | POA: Diagnosis not present

## 2019-10-18 DIAGNOSIS — M79674 Pain in right toe(s): Secondary | ICD-10-CM

## 2019-10-18 NOTE — Patient Instructions (Addendum)
Diabetes Mellitus and Foot Care Foot care is an important part of your health, especially when you have diabetes. Diabetes may cause you to have problems because of poor blood flow (circulation) to your feet and legs, which can cause your skin to:  Become thinner and drier.  Break more easily.  Heal more slowly.  Peel and crack. You may also have nerve damage (neuropathy) in your legs and feet, causing decreased feeling in them. This means that you may not notice minor injuries to your feet that could lead to more serious problems. Noticing and addressing any potential problems early is the best way to prevent future foot problems. How to care for your feet Foot hygiene  Wash your feet daily with warm water and mild soap. Do not use hot water. Then, pat your feet and the areas between your toes until they are completely dry. Do not soak your feet as this can dry your skin.  Trim your toenails straight across. Do not dig under them or around the cuticle. File the edges of your nails with an emery board or nail file.  Apply a moisturizing lotion or petroleum jelly to the skin on your feet and to dry, brittle toenails. Use lotion that does not contain alcohol and is unscented. Do not apply lotion between your toes. Shoes and socks  Wear clean socks or stockings every day. Make sure they are not too tight. Do not wear knee-high stockings since they may decrease blood flow to your legs.  Wear shoes that fit properly and have enough cushioning. Always look in your shoes before you put them on to be sure there are no objects inside.  To break in new shoes, wear them for just a few hours a day. This prevents injuries on your feet. Wounds, scrapes, corns, and calluses  Check your feet daily for blisters, cuts, bruises, sores, and redness. If you cannot see the bottom of your feet, use a mirror or ask someone for help.  Do not cut corns or calluses or try to remove them with medicine.  If you  find a minor scrape, cut, or break in the skin on your feet, keep it and the skin around it clean and dry. You may clean these areas with mild soap and water. Do not clean the area with peroxide, alcohol, or iodine.  If you have a wound, scrape, corn, or callus on your foot, look at it several times a day to make sure it is healing and not infected. Check for: ? Redness, swelling, or pain. ? Fluid or blood. ? Warmth. ? Pus or a bad smell. General instructions  Do not cross your legs. This may decrease blood flow to your feet.  Do not use heating pads or hot water bottles on your feet. They may burn your skin. If you have lost feeling in your feet or legs, you may not know this is happening until it is too late.  Protect your feet from hot and cold by wearing shoes, such as at the beach or on hot pavement.  Schedule a complete foot exam at least once a year (annually) or more often if you have foot problems. If you have foot problems, report any cuts, sores, or bruises to your health care provider immediately. Contact a health care provider if:  You have a medical condition that increases your risk of infection and you have any cuts, sores, or bruises on your feet.  You have an injury that is not   healing.  You have redness on your legs or feet.  You feel burning or tingling in your legs or feet.  You have pain or cramps in your legs and feet.  Your legs or feet are numb.  Your feet always feel cold.  You have pain around a toenail. Get help right away if:  You have a wound, scrape, corn, or callus on your foot and: ? You have pain, swelling, or redness that gets worse. ? You have fluid or blood coming from the wound, scrape, corn, or callus. ? Your wound, scrape, corn, or callus feels warm to the touch. ? You have pus or a bad smell coming from the wound, scrape, corn, or callus. ? You have a fever. ? You have a red line going up your leg. Summary  Check your feet every day  for cuts, sores, red spots, swelling, and blisters.  Moisturize feet and legs daily.  Wear shoes that fit properly and have enough cushioning.  If you have foot problems, report any cuts, sores, or bruises to your health care provider immediately.  Schedule a complete foot exam at least once a year (annually) or more often if you have foot problems. This information is not intended to replace advice given to you by your health care provider. Make sure you discuss any questions you have with your health care provider. Document Revised: 10/12/2018 Document Reviewed: 02/21/2016 Elsevier Patient Education  [place]? An infection that lies within the keratin of your nail plate that is caused by a fungus.  WHY ME? Fungal infections affect all ages, sexes, races, and creeds.  There may be many factors that predispose you to a fungal infection such as age, coexisting medical conditions such as diabetes, or an autoimmune disease; stress, medications, fatigue, genetics, etc.  Bottom line: fungus thrives in a warm, moist environment and your shoes offer such a location.  IS IT CONTAGIOUS? Theoretically, yes.  You do not want to share shoes, nail clippers or files with someone who has fungal toenails.  Walking around barefoot in the same room or sleeping in the same bed is unlikely to transfer the organism.  It is important to realize, however, that fungus can spread easily from one nail to the next on the same foot.  HOW DO WE TREAT THIS?  There are several ways to treat this condition.  Treatment may depend on many factors such as age, medications, pregnancy, liver and kidney conditions, etc.  It is best to ask your doctor which options are available to you.  5. No treatment.   Unlike many other medical concerns, you can live with this condition.  However for many people this can be a painful condition and may lead to ingrown toenails or a  bacterial infection.  It is recommended that you keep the nails cut short to help reduce the amount of fungal nail. 6. Topical treatment.  These range from herbal remedies to prescription strength nail lacquers.  About 40-50% effective, topicals require twice daily application for approximately 9 to 12 months or until an entirely new nail has grown out.  The most effective topicals are medical grade medications available through physicians offices. 7. Oral antifungal medications.  With an 80-90% cure rate, the most common oral medication requires 3 to 4 months of therapy and stays in your system for a year as the new nail grows out.  Oral antifungal medications do require blood work to  make sure it is a safe drug for you.  A liver function panel will be performed prior to starting the medication and after the first month of treatment.  It is important to have the blood work performed to avoid any harmful side effects.  In general, this medication safe but blood work is required. 8. Laser Therapy.  This treatment is performed by applying a specialized laser to the affected nail plate.  This therapy is noninvasive, fast, and non-painful.  It is not covered by insurance and is therefore, out of pocket.  The results have been very good with a 80-95% cure rate.  The [facility] is the only practice in the area to offer this therapy. 9. Permanent Nail Avulsion.  Removing the entire nail so that a new nail will not grow back.   Ingrown Toenail An ingrown toenail occurs when the corner or sides of a toenail grow into the surrounding skin. This causes discomfort and pain. The big toe is most commonly affected, but any of the toes can be affected. If an ingrown toenail is not treated, it can become infected. What are the causes? This condition may be caused by:  Wearing shoes that are too small or tight.  An injury, such as stubbing your toe or having your toe stepped on.  Improper cutting or care of  your toenails.  Having nail or foot abnormalities that were present from birth (congenital abnormalities), such as having a nail that is too big for your toe. What increases the risk? The following factors may make you more likely to develop ingrown toenails:  Age. Nails tend to get thicker with age, so ingrown nails are more common among older people.  Cutting your toenails incorrectly, such as cutting them very short or cutting them unevenly. An ingrown toenail is more likely to get infected if you have:  Diabetes.  Blood flow (circulation) problems. What are the signs or symptoms? Symptoms of an ingrown toenail may include:  Pain, soreness, or tenderness.  Redness.  Swelling.  Hardening of the skin that surrounds the toenail. Signs that an ingrown toenail may be infected include:  Fluid or pus.  Symptoms that get worse instead of better. How is this diagnosed? An ingrown toenail may be diagnosed based on your medical history, your symptoms, and a physical exam. If you have fluid or blood coming from your toenail, a sample may be collected to test for the specific type of bacteria that is causing the infection. How is this treated? Treatment depends on how severe your ingrown toenail is. You may be able to care for your toenail at home.  If you have an infection, you may be prescribed antibiotic medicines.  If you have fluid or pus draining from your toenail, your health care provider may drain it.  If you have trouble walking, you may be given crutches to use.  If you have a severe or infected ingrown toenail, you may need a procedure to remove part or all of the nail. Follow these instructions at home: Foot care   Do not pick at your toenail or try to remove it yourself.  Soak your foot in warm, soapy water. Do this for 20 minutes, 3 times a day, or as often as told by your health care provider. This helps to keep your toe clean and keep your skin soft.  Wear  shoes that fit well and are not too tight. Your health care provider may recommend that you wear  open-toed shoes while you heal.  Trim your toenails regularly and carefully. Cut your toenails straight across to prevent injury to the skin at the corners of the toenail. Do not cut your nails in a curved shape.  Keep your feet clean and dry to help prevent infection. Medicines  Take over-the-counter and prescription medicines only as told by your health care provider.  If you were prescribed an antibiotic, take it as told by your health care provider. Do not stop taking the antibiotic even if you start to feel better. Activity  Return to your normal activities as told by your health care provider. Ask your health care provider what activities are safe for you.  Avoid activities that cause pain. General instructions  If your health care provider told you to use crutches to help you move around, use them as instructed.  Keep all follow-up visits as told by your health care provider. This is important. Contact a health care provider if:  You have more redness, swelling, pain, or other symptoms that do not improve with treatment.  You have fluid, blood, or pus coming from your toenail. Get help right away if:  You have a red streak on your skin that starts at your foot and spreads up your leg.  You have a fever. Summary  An ingrown toenail occurs when the corner or sides of a toenail grow into the surrounding skin. This causes discomfort and pain. The big toe is most commonly affected, but any of the toes can be affected.  If an ingrown toenail is not treated, it can become infected.  Fluid or pus draining from your toenail is a sign of infection. Your health care provider may need to drain it. You may be given antibiotics to treat the infection.  Trimming your toenails regularly and properly can help you prevent an ingrown toenail. This information is not intended to replace advice  given to you by your health care provider. Make sure you discuss any questions you have with your health care provider. Document Revised: 05/13/2018 Document Reviewed: 10/07/2016 Elsevier Patient Education  [place].

## 2019-10-22 NOTE — Progress Notes (Signed)
Subjective: Christian Estrada presents today referred by [name], MD for diabetic foot evaluation.  Patient relates 20 year history of diabetes.  Patient denies any history of foot wounds.  Patient does relate  Numbness of RLE. He sustained a fall in 2018 requiring ORIF of titanium rod in right leg. 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  Today, patient c/o of painful, discolored, thick toenails which interfere with daily activities.  Pain is aggravated when wearing enclosed shoe gear. He is unable to trim his toenails.  Past Medical History:  Diagnosis Date  . Allergy    pcn  . Back pain   . BPH with obstruction/lower urinary tract symptoms   . Diabetes mellitus   . Dysrhythmia   . ED (erectile dysfunction)   . GERD (gastroesophageal reflux disease)   . Hypercholesterolemia   . Hypertension   . Prostate cancer ([name]) 01/07/12   Adenocarcinoma, gleason 4+3=7,& 3+3=6,& 3+4=7,PSA=6.08,Volume=18.95cc  . Radiation 04/07/12-06/03/12   7800 cGy,seminal vesicles 5600 cGy  . Reflux   . Shortness of breath     Patient Active Problem List   Diagnosis Date Noted  . Postoperative anemia due to acute blood loss 02/23/2016  . Femur fracture, right ([name]) 02/21/2016  . Reflux   . Hypertension   . Back pain   . Shortness of breath   . GERD (gastroesophageal reflux disease)   . Dysrhythmia   . Hypercholesterolemia   . BPH with obstruction/lower urinary tract symptoms   . Prostate cancer ([name]) 01/07/2012  . Anemia 09/10/2011  . Blood in stool 09/10/2011  . Diabetes mellitus ([place]) 09/10/2011  . HTN (hypertension) 09/10/2011    Past Surgical History:  Procedure Laterality Date  . CIRCUMCISION    . ESOPHAGOGASTRODUODENOSCOPY  09/11/2011   Procedure: ESOPHAGOGASTRODUODENOSCOPY (EGD);  Surgeon: [name], MD;  Location: [name] ENDOSCOPY;  Service: Endoscopy;  Laterality: N/A;  . ORIF FEMUR FRACTURE Right 02/22/2016   Procedure: OPEN REDUCTION INTERNAL FIXATION (ORIF) DISTAL FEMUR FRACTURE;   Surgeon: [name], MD;  Location: [name];  Service: Orthopedics;  Laterality: Right;    Current Outpatient Medications on File Prior to Visit  Medication Sig Dispense Refill  . aspirin 81 MG tablet Take 81 mg by mouth once a week. On Thursdays    . metFORMIN (GLUCOPHAGE) 1000 MG tablet Take 1,000 mg by mouth 2 (two) times daily.    . Multiple Vitamin (MULTIVITAMIN WITH MINERALS) TABS Take 1 tablet by mouth every morning.    . ONGLYZA 5 MG TABS tablet Take 5 mg by mouth daily.     . vitamin C (ASCORBIC ACID) 500 MG tablet Take 500 mg by mouth daily.     No current facility-administered medications on file prior to visit.     Allergies  Allergen Reactions  . Penicillins Other (See Comments)    Unknown-reaction years ago    Social History   Occupational History  . Occupation: RETIRED  Tobacco Use  . Smoking status: Never Smoker  . Smokeless tobacco: Never Used  Substance and Sexual Activity  . Alcohol use: No  . Drug use: No  . Sexual activity: Not on file    Family History  Problem Relation Age of Onset  . Diabetes Mother   . Cancer Brother        prostate  . Cancer Brother        prostate     There is no immunization history on file for this patient.  Review of systems: Positive  Findings in bold print.  Constitutional:  chills, fatigue, fever, sweats, weight change Communication: Optometrist, sign Ecologist, hand writing, iPad/Android device Head: headaches, head injury Eyes: changes in vision, eye pain, glaucoma, cataracts, macular degeneration, diplopia, glare,  light sensitivity, eyeglasses or contacts, blindness Ears nose mouth throat: hearing impaired, hearing aids,  ringing in ears, deaf, sign language,  vertigo, nosebleeds,  rhinitis,  cold sores, snoring, swollen glands Cardiovascular: HTN, edema, arrhythmia, pacemaker in place, defibrillator in place, chest pain/tightness, chronic anticoagulation, blood clot, heart failure, MI Peripheral  Vascular: leg cramps, varicose veins, blood clots, lymphedema, varicosities Respiratory:  difficulty breathing, denies congestion, SOB, wheezing, cough, emphysema Gastrointestinal: change in appetite or weight, abdominal pain, constipation, diarrhea, nausea, vomiting, vomiting blood, change in bowel habits, abdominal pain, jaundice, rectal bleeding, hemorrhoids, GERD Genitourinary:  nocturia,  pain on urination, polyuria,  blood in urine, Foley catheter, urinary urgency, ESRD on hemodialysis Musculoskeletal: amputation, cramping, stiff joints, painful joints, decreased joint motion, fractures, OA, gout, hemiplegia, paraplegia, uses cane, wheelchair bound, uses walker, uses rollator Skin: +changes in toenails, color change, dryness, itching, mole changes,  rash, wound(s) Neurological: headaches, numbness in feet, paresthesias in feet, burning in feet, fainting,  seizures, change in speech,  headaches, memory problems/poor historian, cerebral palsy, weakness, paralysis, CVA, TIA Endocrine: diabetes, hypothyroidism, hyperthyroidism,  goiter, dry mouth, flushing, heat intolerance,  cold intolerance,  excessive thirst, denies polyuria,  nocturia Hematological:  easy bleeding, excessive bleeding, easy bruising, enlarged lymph nodes, on long term blood thinner, history of past transusions Allergy/immunological:  hives, eczema, frequent infections, multiple drug allergies, seasonal allergies, transplant recipient, multiple food allergies Psychiatric:  anxiety, depression, mood disorder, suicidal ideations, hallucinations, insomnia  Objective: There were no vitals filed for this visit.  Christian Estrada is a pleasant 74 y.o. Caucasian male WD, WN in NAD.[name] AAO X 3.  Vascular Examination: Capillary refill time to digits immediate b/l. Palpable pedal pulses b/l LE. Pedal hair sparse. Lower extremity skin temperature gradient within normal limits. No pain with calf compression b/l.  Dermatological  Examination: Pedal skin with normal turgor, texture and tone bilaterally. No open wounds bilaterally. No interdigital macerations bilaterally. Toenails 1-5 b/l elongated, discolored, dystrophic, thickened, crumbly with subungual debris and tenderness to dorsal palpation. Incurvated nailplate bilateral border(s) L hallux and R hallux.  Nail border hypertrophy minimal. There is tenderness to palpation. Sign(s) of infection: no clinical signs of infection noted on examination today..  Musculoskeletal Examination: Normal muscle strength 5/5 to all lower extremity muscle groups bilaterally. No pain crepitus or joint limitation noted with ROM b/l. No gross bony deformities bilaterally.  Footwear Assessment: Does the patient wear appropriate shoes? Yes. Does the patient need inserts/orthotics? No.  Neurological Examination: Protective sensation intact 5/5 intact bilaterally with 10g monofilament b/l. Vibratory sensation decreased b/l. Proprioception intact bilaterally. Clonus negative b/l.  Assessment: 1. Pain due to onychomycosis of toenails of both feet   2. Ingrown toenail without infection   3. Type 2 diabetes mellitus without complication, without long-term current use of insulin ([place])   4. Encounter for diabetic foot exam ([place])    ADA Risk Categorization:  Low Risk:  Patient has all of the following: Intact protective sensation No prior foot ulcer  No severe deformity Pedal pulses present  Plan: -Examined patient. -Diabetic foot examination performed on today's visit. -Discussed diabetic foot care principles. Literature dispensed on today. -Toenails 1-5 b/l were debrided in length and girth with sterile nail nippers and dremel without iatrogenic bleeding.  -Patient to report any pedal  injuries to medical professional immediately. -Patient to continue soft, supportive shoe gear daily. -Patient/POA to call should there be question/concern in the interim.  Return in about 3 months  (around 01/17/2020) for diabetic nail trim.

## 2020-01-19 ENCOUNTER — Ambulatory Visit: Payer: Medicare Other | Admitting: Podiatry

## 2020-01-19 ENCOUNTER — Other Ambulatory Visit: Payer: Self-pay

## 2020-01-19 ENCOUNTER — Encounter: Payer: Self-pay | Admitting: Podiatry

## 2020-01-19 DIAGNOSIS — I350 Nonrheumatic aortic (valve) stenosis: Secondary | ICD-10-CM | POA: Insufficient documentation

## 2020-01-19 DIAGNOSIS — L600 Ingrowing nail: Secondary | ICD-10-CM | POA: Diagnosis not present

## 2020-01-19 DIAGNOSIS — E663 Overweight: Secondary | ICD-10-CM | POA: Insufficient documentation

## 2020-01-19 DIAGNOSIS — N529 Male erectile dysfunction, unspecified: Secondary | ICD-10-CM | POA: Insufficient documentation

## 2020-01-19 DIAGNOSIS — M79675 Pain in left toe(s): Secondary | ICD-10-CM

## 2020-01-19 DIAGNOSIS — E1121 Type 2 diabetes mellitus with diabetic nephropathy: Secondary | ICD-10-CM | POA: Insufficient documentation

## 2020-01-19 DIAGNOSIS — M79674 Pain in right toe(s): Secondary | ICD-10-CM | POA: Diagnosis not present

## 2020-01-19 DIAGNOSIS — I517 Cardiomegaly: Secondary | ICD-10-CM | POA: Insufficient documentation

## 2020-01-19 DIAGNOSIS — E1165 Type 2 diabetes mellitus with hyperglycemia: Secondary | ICD-10-CM | POA: Diagnosis not present

## 2020-01-19 DIAGNOSIS — I359 Nonrheumatic aortic valve disorder, unspecified: Secondary | ICD-10-CM | POA: Insufficient documentation

## 2020-01-19 DIAGNOSIS — IMO0002 Reserved for concepts with insufficient information to code with codable children: Secondary | ICD-10-CM | POA: Insufficient documentation

## 2020-01-19 DIAGNOSIS — R011 Cardiac murmur, unspecified: Secondary | ICD-10-CM | POA: Insufficient documentation

## 2020-01-19 DIAGNOSIS — B351 Tinea unguium: Secondary | ICD-10-CM | POA: Diagnosis not present

## 2020-01-19 NOTE — Patient Instructions (Signed)
EPSOM SALT FOOT SOAK INSTRUCTIONS  *IF YOU HAVE BEEN PRESCRIBED ANTIBIOTICS, TAKE AS INSTRUCTED UNTIL ALL ARE GONE*  Shopping List:  A. Plain epsom salt (not scented) B. Neosporin Cream/Ointment or Bacitracin Cream/Ointment (or prescribed antiobiotic drops/cream/ointment) C. 1-inch fabric band-aids  1.  Place 1/4 cup of epsom salts in 2 quarts of warm tap water. IF YOU ARE DIABETIC, OR HAVE NEUROPATHY, CHECK THE TEMPERATURE OF THE WATER WITH YOUR ELBOW.  2.  Submerge your foot/feet in the solution and soak for 10-15 minutes.      3.  Next, remove your foot/feet from solution, blot dry the affected area.    4.  Apply light amount of antibiotic cream/ointment and cover with fabric band-aid .  5.  This soak should be done once a day for 7 days.   6.  Monitor for any signs/symptoms of infection such as redness, swelling, odor, drainage, increased pain, or non-healing of digit.   7.  Please do not hesitate to call the office and speak to a Nurse or Doctor if you have questions.   8.  If you experience fever, chills, nightsweats, nausea or vomiting with worsening of digit, please go to the emergency room.   

## 2020-01-19 NOTE — Progress Notes (Signed)
Subjective:  Patient ID: Christian Estrada, male    DOB: 26-Apr-1945,  MRN: [id number]  74 y.o. male presents with preventative diabetic foot care and painful thick toenails that are difficult to trim. Pain interferes with ambulation. Aggravating factors include wearing enclosed shoe gear. Pain is relieved with periodic professional debridement..    Patient's blood sugar was 148 mg/dl yesterday morning. He states he checks blood sugar every other day.  PCP: [name], MD and last visit was: September 2021.  Patient c/o painful right great toe medial border. States it is tender when wearing enclosed shoe gear. Denies any redness, drainage or swelling of digit. He is wearing his house slippers on today's visit.  Review of Systems: Negative except as noted in the HPI.  Past Medical History:  Diagnosis Date  . Allergy    pcn  . Back pain   . BPH with obstruction/lower urinary tract symptoms   . Diabetes mellitus   . Dysrhythmia   . ED (erectile dysfunction)   . GERD (gastroesophageal reflux disease)   . Hypercholesterolemia   . Hypertension   . Prostate cancer ([place]) 01/07/12   Adenocarcinoma, gleason 4+3=7,& 3+3=6,& 3+4=7,PSA=6.08,Volume=18.95cc  . Radiation 04/07/12-06/03/12   7800 cGy,seminal vesicles 5600 cGy  . Reflux   . Shortness of breath    Past Surgical History:  Procedure Laterality Date  . CIRCUMCISION    . ESOPHAGOGASTRODUODENOSCOPY  09/11/2011   Procedure: ESOPHAGOGASTRODUODENOSCOPY (EGD);  Surgeon: [name], MD;  Location: [facility] ENDOSCOPY;  Service: Endoscopy;  Laterality: N/A;  . ORIF FEMUR FRACTURE Right 02/22/2016   Procedure: OPEN REDUCTION INTERNAL FIXATION (ORIF) DISTAL FEMUR FRACTURE;  Surgeon: [name], MD;  Location: [facility];  Service: Orthopedics;  Laterality: Right;   Patient Active Problem List   Diagnosis Date Noted  . Type II diabetes mellitus, uncontrolled ([place]) 01/19/2020  . Aortic stenosis, mild 01/19/2020  . Aortic valve disorder 01/19/2020  .  Cardiomegaly 01/19/2020  . Diabetic renal disease ([place]) 01/19/2020  . 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  . Morbid obesity ([place]) 01/19/2020  . Overweight 01/19/2020  . Heart murmur 01/19/2020  . Postoperative anemia due to acute blood loss 02/23/2016  . Femur fracture, right ([place]) 02/21/2016  . Reflux   . Hypertension   . Back pain   . Shortness of breath   . GERD (gastroesophageal reflux disease)   . Dysrhythmia   . Hypercholesterolemia   . BPH with obstruction/lower urinary tract symptoms   . Prostate cancer ([place]) 01/07/2012  . Anemia 09/10/2011  . Blood in stool 09/10/2011  . Diabetes mellitus ([place]) 09/10/2011  . HTN (hypertension) 09/10/2011    Current Outpatient Medications:  .  aspirin 81 MG tablet, Take 81 mg by mouth once a week. On Thursdays, Disp: , Rfl:  .  atorvastatin (LIPITOR) 20 MG tablet, Take 20 mg by mouth daily., Disp: , Rfl:  .  Blood Glucose Monitoring Suppl (ACCU-CHEK GUIDE) w/Device KIT, Use to check your blood sugar, Disp: , Rfl:  .  famotidine (PEPCID AC) 10 MG tablet, 1 tablet as needed, Disp: , Rfl:  .  glucose blood (ACCU-CHEK GUIDE) test strip, use to check your blood sugar, Disp: , Rfl:  .  metFORMIN (GLUCOPHAGE) 1000 MG tablet, Take 1,000 mg by mouth 2 (two) times daily., Disp: , Rfl:  .  Multiple Vitamin (MULTIVITAMIN WITH MINERALS) TABS, Take 1 tablet by mouth every morning., Disp: , Rfl:  .  ONGLYZA 5 MG TABS tablet, Take 5 mg by mouth  daily. , Disp: , Rfl:  .  pioglitazone (ACTOS) 30 MG tablet, Take 30 mg by mouth daily., Disp: , Rfl:  .  ramipril (ALTACE) 2.5 MG capsule, Take 2.5 mg by mouth daily., Disp: , Rfl:  .  vitamin C (ASCORBIC ACID) 500 MG tablet, Take 500 mg by mouth daily., Disp: , Rfl:  Allergies  Allergen Reactions  . Penicillins Other (See Comments)    Unknown-reaction years ago   Social History   Tobacco Use  Smoking Status Never Smoker  Smokeless Tobacco Never Used    Objective:  There were no  vitals filed for this visit. Constitutional Patient is a pleasant 74 y.o. African American male morbidly obese in NAD. AAO x 3.  Vascular Capillary refill time to digits immediate b/l. Palpable pedal pulses b/l LE. Pedal hair sparse. Lower extremity skin temperature gradient within normal limits. No cyanosis or clubbing noted.  Neurologic Normal speech. Protective sensation intact 5/5 intact bilaterally with 10g monofilament b/l. Vibratory sensation decreased b/l.  Dermatologic Pedal skin with normal turgor, texture and tone bilaterally. No open wounds bilaterally. No interdigital macerations bilaterally. Toenails 1-5 b/l elongated, discolored, dystrophic, thickened, crumbly with subungual debris and tenderness to dorsal palpation. Incurvated nailplate medial border(s) R hallux.  Nail border hypertrophy mild. There is tenderness to palpation. Sign(s) of infection: pain on palpation, pinpoint purulent drainage. No penetration into deep tissues.  Orthopedic: Normal muscle strength 5/5 to all lower extremity muscle groups bilaterally. No pain crepitus or joint limitation noted with ROM b/l. No gross bony deformities bilaterally. Utilizes cane for ambulation assistance.     Assessment:  No diagnosis found. Plan:  Patient was evaluated and treated and all questions answered.  Onychomycosis with pain -Nails palliatively debridement as below. -Educated on self-care  Procedure: Nail Debridement Rationale: Pain Type of Debridement: manual, sharp debridement. Instrumentation: Nail nipper, rotary burr. Number of Nails: 10  -Examined patient. -Continue diabetic foot care principles. -Patient to continue soft, supportive shoe gear daily. -Toenails 1-5 b/l were debrided in length and girth with sterile nail nippers and dremel without iatrogenic bleeding.  -Offending nail border debrided and curretaged R hallux utilizing sterile nail nipper and currette. Border(s) cleansed with alcohol and triple  antibiotic ointment applied. Patient noted immediate relief post-treatment today. Dispensed written instructions for once daily epsom salt soaks for 7 days. His wife will assist with epsom salt soaks. -Patient to report any pedal injuries to medical professional immediately. -Patient/POA to call should there be question/concern in the interim.  Return in about 3 months (around 04/18/2020) for diabetic foot care.  Marzetta Board, DPM

## 2020-04-19 ENCOUNTER — Other Ambulatory Visit: Payer: Self-pay

## 2020-04-19 ENCOUNTER — Encounter: Payer: Self-pay | Admitting: Podiatry

## 2020-04-19 ENCOUNTER — Ambulatory Visit (INDEPENDENT_AMBULATORY_CARE_PROVIDER_SITE_OTHER): Payer: Medicare Other | Admitting: Podiatry

## 2020-04-19 DIAGNOSIS — M79674 Pain in right toe(s): Secondary | ICD-10-CM | POA: Diagnosis not present

## 2020-04-19 DIAGNOSIS — E1165 Type 2 diabetes mellitus with hyperglycemia: Secondary | ICD-10-CM

## 2020-04-19 DIAGNOSIS — B351 Tinea unguium: Secondary | ICD-10-CM

## 2020-04-19 DIAGNOSIS — M79675 Pain in left toe(s): Secondary | ICD-10-CM

## 2020-04-19 NOTE — Progress Notes (Signed)
  Subjective:  Patient ID: Christian Estrada, male    DOB: 05-22-1945,  MRN: [id number]  75 y.o. male presents with preventative diabetic foot care and painful thick toenails that are difficult to trim. Pain interferes with ambulation. Aggravating factors include wearing enclosed shoe gear. Pain is relieved with periodic professional debridement..    Patient's blood sugar was 144 mg/dl yesterday morning. He states he checks blood sugar every other day.  PCP: [name], MD and last visit was: one week ago.  Review of Systems: Negative except as noted in the HPI.   Allergies  Allergen Reactions  . Other Other (See Comments)  . Penicillins Other (See Comments)    Unknown-reaction years ago   Objective:  There were no vitals filed for this visit. Constitutional Patient is a pleasant 75 y.o. African American male morbidly obese in NAD. AAO x 3.  Vascular Capillary refill time to digits immediate b/l. Palpable pedal pulses b/l LE. Pedal hair sparse. Lower extremity skin temperature gradient within normal limits. No cyanosis or clubbing noted.  Neurologic Normal speech. Protective sensation intact 5/5 intact bilaterally with 10g monofilament b/l. Vibratory sensation decreased b/l.  Dermatologic Pedal skin with normal turgor, texture and tone bilaterally. No open wounds bilaterally. No interdigital macerations bilaterally. Toenails 1-5 b/l elongated, discolored, dystrophic, thickened, crumbly with subungual debris and tenderness to dorsal palpation. Incurvated nailplate medial border(s) R hallux.  No nail border hypertrophy mild. No tenderness to palpation. Sign(s) of infection: No signs of infection noted  Orthopedic: Normal muscle strength 5/5 to all lower extremity muscle groups bilaterally. No pain crepitus or joint limitation noted with ROM b/l. No gross bony deformities bilaterally. Utilizes cane for ambulation assistance.     Assessment:   1. Pain due to onychomycosis of toenails of  both feet   2. Uncontrolled type 2 diabetes mellitus with hyperglycemia ([place])    Plan:  Patient was evaluated and treated and all questions answered.  Onychomycosis with pain -Nails palliatively debridement as below. -Educated on self-care  Procedure: Nail Debridement Rationale: Pain Type of Debridement: manual, sharp debridement. Instrumentation: Nail nipper, rotary burr. Number of Nails: 10  -Examined patient. -Continue diabetic foot care principles. -Patient to continue soft, supportive shoe gear daily. -Toenails 1-5 b/l were debrided in length and girth with sterile nail nippers and dremel without iatrogenic bleeding.  -Patient to report any pedal injuries to medical professional immediately. -Patient/POA to call should there be question/concern in the interim.  Return in about 3 months (around 07/20/2020).  Marzetta Board, DPM

## 2020-08-02 ENCOUNTER — Ambulatory Visit: Payer: Medicare Other | Admitting: Podiatry

## 2020-08-16 ENCOUNTER — Other Ambulatory Visit: Payer: Self-pay

## 2020-08-16 ENCOUNTER — Ambulatory Visit: Payer: Medicare Other | Admitting: Podiatry

## 2020-08-16 DIAGNOSIS — M79674 Pain in right toe(s): Secondary | ICD-10-CM

## 2020-08-16 DIAGNOSIS — E119 Type 2 diabetes mellitus without complications: Secondary | ICD-10-CM

## 2020-08-16 DIAGNOSIS — M79675 Pain in left toe(s): Secondary | ICD-10-CM

## 2020-08-16 DIAGNOSIS — B351 Tinea unguium: Secondary | ICD-10-CM

## 2020-08-18 ENCOUNTER — Encounter: Payer: Self-pay | Admitting: Podiatry

## 2020-08-18 NOTE — Progress Notes (Signed)
Subjective: Christian Estrada is a pleasant 75 y.o. male patient seen today for preventative diabetic foot care. She is seen for painful thick toenails that are difficult to trim. Pain interferes with ambulation. Aggravating factors include wearing enclosed shoe gear. Pain is relieved with periodic professional debridement.  Patient states his blood glucose was 147 mg/dl today.  PCP is [name], MD. Last visit was: June, 2022.  Allergies  Allergen Reactions   Other Other (See Comments)   Penicillins Other (See Comments)    Unknown-reaction years ago    Objective: Physical Exam  General: Christian Estrada is a pleasant 75 y.o. African American male, obese in NAD. AAO x 3.   Vascular:  Capillary refill time to digits immediate b/l. Palpable DP pulse(s) b/l lower extremities Palpable PT pulse(s) b/l lower extremities Pedal hair sparse. Lower extremity skin temperature gradient within normal limits. No pain with calf compression b/l. No edema noted b/l lower extremities.  Dermatological:  Pedal skin with normal turgor, texture and tone b/l lower extremities. No open wounds b/l lower extremities. No interdigital macerations b/l lower extremities. Toenails 1-5 b/l elongated, discolored, dystrophic, thickened, crumbly with subungual debris and tenderness to dorsal palpation.  Musculoskeletal:  Normal muscle strength 5/5 to all lower extremity muscle groups bilaterally. No pain crepitus or joint limitation noted with ROM b/l. No gross bony deformities bilaterally. Utilizes cane for ambulation assistance.  Neurological:  Protective sensation intact 5/5 intact bilaterally with 10g monofilament b/l. Vibratory sensation decreased b/l.  Assessment and Plan:  1. Pain due to onychomycosis of toenails of both feet   2. Type 2 diabetes mellitus without complication, without long-term current use of insulin ([facility])      -No new findings. No new orders. -Continue diabetic foot care  principles. -Patient to continue soft, supportive shoe gear daily. -Toenails 1-5 b/l were debrided in length and girth with sterile nail nippers and dremel without iatrogenic bleeding.  -Patient to report any pedal injuries to medical professional immediately. -Patient/POA to call should there be question/concern in the interim.  Return in about 3 months (around 11/16/2020).  Marzetta Board, DPM

## 2020-11-22 ENCOUNTER — Ambulatory Visit: Payer: Medicare Other | Admitting: Podiatry

## 2020-11-22 ENCOUNTER — Other Ambulatory Visit: Payer: Self-pay

## 2020-11-22 ENCOUNTER — Encounter: Payer: Self-pay | Admitting: Podiatry

## 2020-11-22 DIAGNOSIS — E119 Type 2 diabetes mellitus without complications: Secondary | ICD-10-CM | POA: Diagnosis not present

## 2020-11-22 DIAGNOSIS — M2141 Flat foot [pes planus] (acquired), right foot: Secondary | ICD-10-CM

## 2020-11-22 DIAGNOSIS — M79674 Pain in right toe(s): Secondary | ICD-10-CM | POA: Diagnosis not present

## 2020-11-22 DIAGNOSIS — M2142 Flat foot [pes planus] (acquired), left foot: Secondary | ICD-10-CM | POA: Diagnosis not present

## 2020-11-22 DIAGNOSIS — B351 Tinea unguium: Secondary | ICD-10-CM | POA: Diagnosis not present

## 2020-11-22 DIAGNOSIS — L600 Ingrowing nail: Secondary | ICD-10-CM | POA: Diagnosis not present

## 2020-11-22 DIAGNOSIS — M79675 Pain in left toe(s): Secondary | ICD-10-CM

## 2020-11-22 NOTE — Progress Notes (Signed)
ANNUAL DIABETIC FOOT EXAM  Subjective: Christian Estrada presents today for for annual diabetic foot examination.  Patient relates 20 year h/o diabetes.  Patient denies any h/o foot wounds.  Patient denies symptoms of foot numbness, tingling or burning in feet.  Patient denies symptoms of pins/needles in feet.  Patient's blood sugar was 141 mg/dl on yesterday. Patient did not check blood glucose this morning. The batteries have died in his glucometer and he plans on replacing them today.  Christian Carol, MD is patient's PCP. Last visit was September, 2022.  Past Medical History:  Diagnosis Date   Allergy    pcn   Back pain    BPH with obstruction/lower urinary tract symptoms    Diabetes mellitus    Dysrhythmia    ED (erectile dysfunction)    GERD (gastroesophageal reflux disease)    Hypercholesterolemia    Hypertension    Prostate cancer ([place]) 01/07/12   Adenocarcinoma, gleason 4+3=7,& 3+3=6,& 3+4=7,PSA=6.08,Volume=18.95cc   Radiation 04/07/12-06/03/12   7800 cGy,seminal vesicles 5600 cGy   Reflux    Shortness of breath    Patient Active Problem List   Diagnosis Date Noted   Type II diabetes mellitus, uncontrolled 01/19/2020   Aortic stenosis, mild 01/19/2020   Aortic valve disorder 01/19/2020   Cardiomegaly 01/19/2020   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   Morbid obesity ([place]) 01/19/2020   Overweight 01/19/2020   Heart murmur 01/19/2020   Postoperative anemia due to acute blood loss 02/23/2016   Femur fracture, right ([place]) 02/21/2016   Reflux    Hypertension    Back pain    Shortness of breath    GERD (gastroesophageal reflux disease)    Dysrhythmia    Hypercholesterolemia    BPH with obstruction/lower urinary tract symptoms    Prostate cancer ([place]) 01/07/2012   Anemia 09/10/2011   Blood in stool 09/10/2011   Diabetes mellitus ([place]) 09/10/2011   HTN (hypertension) 09/10/2011   Past Surgical  History:  Procedure Laterality Date   CIRCUMCISION     ESOPHAGOGASTRODUODENOSCOPY  09/11/2011   Procedure: ESOPHAGOGASTRODUODENOSCOPY (EGD);  Surgeon: [name], MD;  Location: [facility] ENDOSCOPY;  Service: Endoscopy;  Laterality: N/A;   ORIF FEMUR FRACTURE Right 02/22/2016   Procedure: OPEN REDUCTION INTERNAL FIXATION (ORIF) DISTAL FEMUR FRACTURE;  Surgeon: [name], MD;  Location: [facility];  Service: Orthopedics;  Laterality: Right;   Current Outpatient Medications on File Prior to Visit  Medication Sig Dispense Refill   aspirin 81 MG tablet Take 81 mg by mouth once a week. On Thursdays     atorvastatin (LIPITOR) 20 MG tablet Take 20 mg by mouth daily.     Blood Glucose Monitoring Suppl (ACCU-CHEK GUIDE) w/Device KIT Use to check your blood sugar     famotidine (PEPCID) 10 MG tablet 1 tablet as needed     glucose blood (ACCU-CHEK GUIDE) test strip use to check your blood sugar     JARDIANCE 25 MG TABS tablet Take 25 mg by mouth daily.     metFORMIN (GLUCOPHAGE) 1000 MG tablet Take 1,000 mg by mouth 2 (two) times daily.     Multiple Vitamin (MULTIVITAMIN WITH MINERALS) TABS Take 1 tablet by mouth every morning.     ramipril (ALTACE) 2.5 MG capsule Take 2.5 mg by mouth daily.     vitamin C (ASCORBIC ACID) 500 MG tablet Take 500 mg by mouth daily.     No current facility-administered medications on file prior  to visit.    Allergies  Allergen Reactions   Other Other (See Comments)   Penicillins Other (See Comments)    Unknown-reaction years ago   Social History   Occupational History   Occupation: RETIRED  Tobacco Use   Smoking status: Never   Smokeless tobacco: Never  Substance and Sexual Activity   Alcohol use: No   Drug use: No   Sexual activity: Not on file   Family History  Problem Relation Age of Onset   Diabetes Mother    Cancer Brother        prostate   Cancer Brother        prostate    There is no immunization history on file for this patient.   Review of  Systems: Negative except as noted in the HPI.   Objective: There were no vitals filed for this visit.  Christian Estrada is a pleasant 75 y.o. African American male, obese,  in NAD. AAO X 3.  Vascular Examination: Capillary fill time to digits <3 seconds b/l lower extremities. Palpable DP pulse(s) b/l lower extremities Faintly palpable PT pulse(s) b/l lower extremities. Pedal hair absent. Lower extremity skin temperature gradient within normal limits. No pain with calf compression b/l. No edema noted b/l lower extremities.  Dermatological Examination: Skin warm and supple b/l lower extremities. No open wounds b/l LE. No interdigital macerations b/l lower extremities. Toenails 1-5 b/l elongated, discolored, dystrophic, thickened, crumbly with subungual debris and tenderness to dorsal palpation. Incurvated nailplate medial border(s) R hallux.  Nail border hypertrophy present. There is tenderness to palpation. Sign(s) of infection: no clinical signs of infection noted on examination today..  Musculoskeletal Examination: Normal muscle strength 5/5 to all lower extremity muscle groups bilaterally. Pes planus deformity noted b/l lower extremities. Utilizes cane for ambulation assistance.  Footwear Assessment: Does the patient wear appropriate shoes? Yes. Does the patient need inserts/orthotics? No.  Neurological Examination: Protective sensation intact 5/5 intact bilaterally with 10g monofilament b/l. Vibratory sensation intact b/l. Deep tendon reflexes normal b/l.  Clonus negative b/l.  Assessment: 1. Pain due to onychomycosis of toenails of both feet   2. Ingrown toenail without infection   3. Pes planus of both feet   4. Type 2 diabetes mellitus without complication, without long-term current use of insulin ([place])   5. Encounter for diabetic foot exam ([place])     ADA Risk Categorization: Low Risk :  Patient has all of the following: Intact protective sensation No prior foot ulcer  No  severe deformity Pedal pulses present  Plan: -Examined patient. -Diabetic foot examination performed today. -Discussed diabetic foot care principles. Literature dispensed on today. -Patient to continue soft, supportive shoe gear daily. -Toenails 2-5 bilaterally and L hallux debrided in length and girth without iatrogenic bleeding with sterile nail nipper and dremel.  -Offending nail border debrided and curretaged R hallux utilizing sterile nail nipper and currette. Border(s) cleansed with alcohol and triple antibiotic ointment and fabric band-aid applied. Dispensed written instructions for once daily epsom salt soaks for 3 days. -Patient to report any pedal injuries to medical professional immediately. -Patient/POA to call should there be question/concern in the interim.  Return in about 3 months (around 02/22/2021).  Marzetta Board, DPM

## 2020-11-22 NOTE — Patient Instructions (Addendum)
If at any time you have a problem with your ingrown toenails, please call the office. If I am not available, do not wait to see me if it's an emergency. We have 12 doctors and anyone of them can assist you.  EPSOM SALT FOOT SOAK INSTRUCTIONS  *IF YOU HAVE BEEN PRESCRIBED ANTIBIOTICS, TAKE AS INSTRUCTED UNTIL ALL ARE GONE*  Shopping List:  A. Plain epsom salt (not scented) B. Neosporin Cream/Ointment or Bacitracin Cream/Ointment (or prescribed antiobiotic drops/cream/ointment) C. 1-inch fabric band-aids   Place 1/4 cup of epsom salts in 2 quarts of warm tap water. IF YOU ARE DIABETIC, OR HAVE NEUROPATHY, CHECK THE TEMPERATURE OF THE WATER WITH YOUR ELBOW.   Submerge your foot/feet in the solution and soak for 10-15 minutes.      3.  Next, remove your foot/feet from solution, blot dry the affected area.    4.  Apply light amount of antibiotic cream/ointment and cover with fabric band-aid .  5.  This soak should be done once a day for 3 days.   6.  Monitor for any signs/symptoms of infection such as redness, swelling, odor, drainage, increased pain, or non-healing of digit.   7.  Please do not hesitate to call the office and speak to a Nurse or Doctor if you have questions.   8.  If you experience fever, chills, nightsweats, nausea or vomiting with worsening of digit, please go to the emergency room.    Diabetes Mellitus and Foot Care Foot care is an important part of your health, especially when you have diabetes. Diabetes may cause you to have problems because of poor blood flow (circulation) to your feet and legs, which can cause your skin to: Become thinner and drier. Break more easily. Heal more slowly. Peel and crack. You may also have nerve damage (neuropathy) in your legs and feet, causing decreased feeling in them. This means that you may not notice minor injuries to your feet that could lead to more serious problems. Noticing and addressing any potential problems early is the  best way to prevent future foot problems. How to care for your feet Foot hygiene  Wash your feet daily with warm water and mild soap. Do not use hot water. Then, pat your feet and the areas between your toes until they are completely dry. Do not soak your feet as this can dry your skin. Trim your toenails straight across. Do not dig under them or around the cuticle. File the edges of your nails with an emery board or nail file. Apply a moisturizing lotion or petroleum jelly to the skin on your feet and to dry, brittle toenails. Use lotion that does not contain alcohol and is unscented. Do not apply lotion between your toes. Shoes and socks Wear clean socks or stockings every day. Make sure they are not too tight. Do not wear knee-high stockings since they may decrease blood flow to your legs. Wear shoes that fit properly and have enough cushioning. Always look in your shoes before you put them on to be sure there are no objects inside. To break in new shoes, wear them for just a few hours a day. This prevents injuries on your feet. Wounds, scrapes, corns, and calluses  Check your feet daily for blisters, cuts, bruises, sores, and redness. If you cannot see the bottom of your feet, use a mirror or ask someone for help. Do not cut corns or calluses or try to remove them with medicine. If you find a  minor scrape, cut, or break in the skin on your feet, keep it and the skin around it clean and dry. You may clean these areas with mild soap and water. Do not clean the area with peroxide, alcohol, or iodine. If you have a wound, scrape, corn, or callus on your foot, look at it several times a day to make sure it is healing and not infected. Check for: Redness, swelling, or pain. Fluid or blood. Warmth. Pus or a bad smell. General tips Do not cross your legs. This may decrease blood flow to your feet. Do not use heating pads or hot water bottles on your feet. They may burn your skin. If you have lost  feeling in your feet or legs, you may not know this is happening until it is too late. Protect your feet from hot and cold by wearing shoes, such as at the beach or on hot pavement. Schedule a complete foot exam at least once a year (annually) or more often if you have foot problems. Report any cuts, sores, or bruises to your health care provider immediately. Where to find more information American Diabetes Association: www.diabetes.org Association of Diabetes Care & Education Specialists: www.diabeteseducator.org Contact a health care provider if: You have a medical condition that increases your risk of infection and you have any cuts, sores, or bruises on your feet. You have an injury that is not healing. You have redness on your legs or feet. You feel burning or tingling in your legs or feet. You have pain or cramps in your legs and feet. Your legs or feet are numb. Your feet always feel cold. You have pain around any toenails. Get help right away if: You have a wound, scrape, corn, or callus on your foot and: You have pain, swelling, or redness that gets worse. You have fluid or blood coming from the wound, scrape, corn, or callus. Your wound, scrape, corn, or callus feels warm to the touch. You have pus or a bad smell coming from the wound, scrape, corn, or callus. You have a fever. You have a red line going up your leg. Summary Check your feet every day for blisters, cuts, bruises, sores, and redness. Apply a moisturizing lotion or petroleum jelly to the skin on your feet and to dry, brittle toenails. Wear shoes that fit properly and have enough cushioning. If you have foot problems, report any cuts, sores, or bruises to your health care provider immediately. Schedule a complete foot exam at least once a year (annually) or more often if you have foot problems. This information is not intended to replace advice given to you by your health care provider. Make sure you discuss any  questions you have with your health care provider. Document Revised: 08/10/2019 Document Reviewed: 08/10/2019 Elsevier Patient Education  [place].

## 2021-03-04 ENCOUNTER — Ambulatory Visit: Payer: Medicare Other | Admitting: Podiatry

## 2021-03-18 ENCOUNTER — Ambulatory Visit: Payer: Medicare Other | Admitting: Podiatry

## 2021-03-18 ENCOUNTER — Encounter: Payer: Self-pay | Admitting: Podiatry

## 2021-03-18 ENCOUNTER — Other Ambulatory Visit: Payer: Self-pay

## 2021-03-18 DIAGNOSIS — B351 Tinea unguium: Secondary | ICD-10-CM

## 2021-03-18 DIAGNOSIS — M79674 Pain in right toe(s): Secondary | ICD-10-CM

## 2021-03-18 DIAGNOSIS — M79675 Pain in left toe(s): Secondary | ICD-10-CM | POA: Diagnosis not present

## 2021-03-23 NOTE — Progress Notes (Signed)
°  Subjective:  Patient ID: Christian Estrada, male    DOB: 11-May-1945,  MRN: [id number]  Christian Estrada presents to clinic today for preventative diabetic foot care and painful elongated mycotic toenails 1-5 bilaterally which are tender when wearing enclosed shoe gear. Pain is relieved with periodic professional debridement.  Patient did not check blood glucose today.  New problem(s): None.   PCP is [name], MD , and last visit was October 30, 2020.  Allergies  Allergen Reactions   Other Other (See Comments)   Penicillins Other (See Comments)    Unknown-reaction years ago    Review of Systems: Negative except as noted in the HPI. Objective:   Constitutional Christian Estrada is a pleasant 76 y.o. African American male, in NAD. AAO x 3.   Vascular CFT <3 seconds b/l LE. Palpable DP pulse(s) b/l LE. Faintly palpable PT pulse(s) b/l LE. Pedal hair absent. No pain with calf compression b/l. Lower extremity skin temperature gradient within normal limits. No edema noted b/l LE. No cyanosis or clubbing noted b/l LE.  Neurologic Normal speech. Oriented to person, place, and time. Protective sensation intact 5/5 intact bilaterally with 10g monofilament b/l. Vibratory sensation intact b/l.  Dermatologic Pedal integument with normal turgor, texture and tone BLE. No open wounds b/l LE. No interdigital macerations noted b/l LE. Incurvated nailplate bilateral border(s) bilateral great toes.  Nail border hypertrophy minimal. There is tenderness to palpation. Sign(s) of infection: no clinical signs of infection noted on examination today..  Orthopedic: Muscle strength 5/5 to all lower extremity muscle groups bilaterally. Pes planus deformity noted bilateral LE.   Radiographs: None Assessment:   1. Pain due to onychomycosis of toenails of both feet    Plan:  Patient was evaluated and treated and all questions answered. Consent given for treatment as described below: -Mycotic toenails 1-5  bilaterally were debrided in length and girth with sterile nail nippers and dremel without incident. -Offending nail border debrided and curretaged bilateral great toes utilizing sterile nail nipper and currette. Border cleansed with alcohol and triple antibiotic applied. No further treatment required by patient/caregiver. -Patient/POA to call should there be question/concern in the interim.  Return in about 10 weeks (around 05/27/2021).  Marzetta Board, DPM

## 2021-05-27 ENCOUNTER — Encounter: Payer: Self-pay | Admitting: Podiatry

## 2021-05-27 ENCOUNTER — Ambulatory Visit: Payer: Medicare Other | Admitting: Podiatry

## 2021-05-27 DIAGNOSIS — M79675 Pain in left toe(s): Secondary | ICD-10-CM

## 2021-05-27 DIAGNOSIS — E119 Type 2 diabetes mellitus without complications: Secondary | ICD-10-CM | POA: Diagnosis not present

## 2021-05-27 DIAGNOSIS — M79674 Pain in right toe(s): Secondary | ICD-10-CM | POA: Diagnosis not present

## 2021-05-27 DIAGNOSIS — B351 Tinea unguium: Secondary | ICD-10-CM

## 2021-06-02 NOTE — Progress Notes (Signed)
?  Subjective:  ?Patient ID: Christian Estrada, male    DOB: January 08, 1946,  MRN: [id number] ? ?[name] presents to clinic today for preventative diabetic foot care and painful thick toenails that are difficult to trim. Pain interferes with ambulation. Aggravating factors include wearing enclosed shoe gear. Pain is relieved with periodic professional debridement.  ? ?Last known HgA1c was around 7%. Patient did not check blood glucose today. ? ?Patient states left great toe feels much better. ? ?New problem(s): None.  ? ?PCP is [name], MD , and last visit was April 08, 2021. ? ?Allergies  ?Allergen Reactions  ? Other Other (See Comments)  ? Penicillins Other (See Comments)  ?  Unknown-reaction years ago  ? ? ?Review of Systems: Negative except as noted in the HPI. ? ?Objective: No changes noted in today's physical examination. ? ?Constitutional Christian Estrada is a pleasant 76 y.o. African American male, in NAD. AAO x 3.   ?Vascular CFT <3 seconds b/l LE. Palpable DP pulse(s) b/l LE. Faintly palpable PT pulse(s) b/l LE. Pedal hair absent. No pain with calf compression b/l. Lower extremity skin temperature gradient within normal limits. No edema noted b/l LE. No cyanosis or clubbing noted b/l LE.  ?Neurologic Normal speech. Oriented to person, place, and time. Protective sensation intact 5/5 intact bilaterally with 10g monofilament b/l. Vibratory sensation intact b/l.  ?Dermatologic Pedal integument with normal turgor, texture and tone BLE. No open wounds b/l LE. No interdigital macerations noted b/l LE. Incurvated nailplate bilateral border(s) bilateral great toes.  Nail border hypertrophy minimal. There is tenderness to palpation. Sign(s) of infection: no clinical signs of infection noted on examination today.[name]  ?Orthopedic: Muscle strength 5/5 to all lower extremity muscle groups bilaterally. Pes planus deformity noted bilateral LE.Wearing Skechers loafers.  ? ?Radiographs: None ? ?Assessment/Plan: ?1.  Pain due to onychomycosis of toenails of both feet   ?2. Type 2 diabetes mellitus without complication, without long-term current use of insulin ([place])   ?-No new findings. No new orders. ?-Patient to continue soft, supportive shoe gear daily. ?-Toenails 1-5 b/l were debrided in length and girth with sterile nail nippers and dremel without iatrogenic bleeding.  ?-Patient/POA to call should there be question/concern in the interim.  ? ?Return in about 3 months (around 08/26/2021). ? ?Marzetta Board, DPM  ?

## 2021-08-13 ENCOUNTER — Ambulatory Visit: Payer: Medicare Other | Admitting: Podiatry

## 2021-09-01 ENCOUNTER — Ambulatory Visit: Payer: Medicare Other | Admitting: Podiatry

## 2021-09-02 ENCOUNTER — Ambulatory Visit: Payer: Medicare Other | Admitting: Podiatry

## 2021-09-02 ENCOUNTER — Encounter: Payer: Self-pay | Admitting: Podiatry

## 2021-09-02 DIAGNOSIS — M79674 Pain in right toe(s): Secondary | ICD-10-CM | POA: Diagnosis not present

## 2021-09-02 DIAGNOSIS — B351 Tinea unguium: Secondary | ICD-10-CM

## 2021-09-02 DIAGNOSIS — M79675 Pain in left toe(s): Secondary | ICD-10-CM | POA: Diagnosis not present

## 2021-09-02 DIAGNOSIS — E119 Type 2 diabetes mellitus without complications: Secondary | ICD-10-CM | POA: Diagnosis not present

## 2021-09-06 NOTE — Progress Notes (Signed)
  Subjective:  Patient ID: Christian Estrada, male    DOB: August 24, 1945,  MRN: [id number]  Christian Estrada presents to clinic today for preventative diabetic foot care and painful elongated mycotic toenails 1-5 bilaterally which are tender when wearing enclosed shoe gear. Pain is relieved with periodic professional debridement.  Last A1c was 7.0%. Patient did not check blood glucose today.  New problem(s): None.   PCP is [name], MD , and last visit was June, 2023.  Allergies  Allergen Reactions   Other Other (See Comments)   Penicillins Other (See Comments)    Unknown-reaction years ago    Review of Systems: Negative except as noted in the HPI.  Objective: No changes noted in today's physical examination. Constitutional Christian Estrada is a pleasant 76 y.o. African American male, in NAD. AAO x 3.   Vascular CFT <3 seconds b/l LE. Palpable DP pulse(s) b/l LE. Faintly palpable PT pulse(s) b/l LE. Pedal hair absent. No pain with calf compression b/l. Lower extremity skin temperature gradient within normal limits. No edema noted b/l LE. No cyanosis or clubbing noted b/l LE.  Neurologic Normal speech. Oriented to person, place, and time. Protective sensation intact 5/5 intact bilaterally with 10g monofilament b/l. Vibratory sensation intact b/l.  Dermatologic Pedal integument with normal turgor, texture and tone BLE. No open wounds b/l LE. No interdigital macerations noted b/l LE. Incurvated nailplate bilateral border(s) bilateral great toes.  Nail border hypertrophy minimal. There is tenderness to palpation. Sign(s) of infection: no clinical signs of infection noted on examination today..  Orthopedic: Muscle strength 5/5 to all lower extremity muscle groups bilaterally. Pes planus deformity noted bilateral LE.Wearing Skechers loafers.   Radiographs: None  Assessment/Plan: 1. Pain due to onychomycosis of toenails of both feet   2. Type 2 diabetes mellitus without complication,  without long-term current use of insulin ([place])   -Patient was evaluated and treated. All patient's and/or POA's questions/concerns answered on today's visit. -Patient to continue soft, supportive shoe gear daily. -Mycotic toenails 1-5 bilaterally were debrided in length and girth with sterile nail nippers and dremel without incident. -Offending nail border debrided and curretaged bilateral great toes utilizing sterile nail nipper and currette. Border cleansed with alcohol and triple antibiotic applied. No further treatment required by patient/caregiver. Call office if there are any concerns. -Patient/POA to call should there be question/concern in the interim.   Return in about 3 months (around 12/03/2021).  Marzetta Board, DPM

## 2021-12-16 ENCOUNTER — Encounter: Payer: Self-pay | Admitting: Podiatry

## 2021-12-16 ENCOUNTER — Ambulatory Visit: Payer: Medicare Other | Admitting: Podiatry

## 2021-12-16 DIAGNOSIS — E119 Type 2 diabetes mellitus without complications: Secondary | ICD-10-CM

## 2021-12-16 DIAGNOSIS — M79675 Pain in left toe(s): Secondary | ICD-10-CM | POA: Diagnosis not present

## 2021-12-16 DIAGNOSIS — M2141 Flat foot [pes planus] (acquired), right foot: Secondary | ICD-10-CM | POA: Diagnosis not present

## 2021-12-16 DIAGNOSIS — M79674 Pain in right toe(s): Secondary | ICD-10-CM

## 2021-12-16 DIAGNOSIS — B351 Tinea unguium: Secondary | ICD-10-CM | POA: Diagnosis not present

## 2021-12-16 DIAGNOSIS — M2041 Other hammer toe(s) (acquired), right foot: Secondary | ICD-10-CM | POA: Diagnosis not present

## 2021-12-16 DIAGNOSIS — E1165 Type 2 diabetes mellitus with hyperglycemia: Secondary | ICD-10-CM

## 2021-12-16 DIAGNOSIS — M2042 Other hammer toe(s) (acquired), left foot: Secondary | ICD-10-CM

## 2021-12-16 DIAGNOSIS — M2142 Flat foot [pes planus] (acquired), left foot: Secondary | ICD-10-CM

## 2021-12-16 NOTE — Progress Notes (Signed)
ANNUAL DIABETIC FOOT EXAM  Subjective: Christian Estrada presents today for annual diabetic foot examination.  Chief Complaint  Patient presents with   Nail Problem    DFC BG - 141 per pt a couple days ago A1C - less than 7.0 PCP - Dr [name] last OV September 2023      Patient confirms h/o diabetes.  Patient relates 20-30 year h/o diabetes.  Patient denies any h/o foot wounds.  Patient denies any numbness, tingling, burning, or pins/needle sensation in feet.  Risk factors: diabetes, HTN, hypercholesterolemia, diabetic renal disease.  [name], MD is patient's PCP.  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,& 3+3=6,& 3+4=7,PSA=6.08,Volume=18.95cc   Radiation 04/07/12-06/03/12   7800 cGy,seminal vesicles 5600 cGy   Reflux    Shortness of breath    Patient Active Problem List   Diagnosis Date Noted   Type II diabetes mellitus, uncontrolled 01/19/2020   Aortic stenosis, mild 01/19/2020   Aortic valve disorder 01/19/2020   Cardiomegaly 01/19/2020   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   Morbid obesity ([place]) 01/19/2020   Overweight 01/19/2020   Heart murmur 01/19/2020   Postoperative anemia due to acute blood loss 02/23/2016   Femur fracture, right ([place]) 02/21/2016   Reflux    Hypertension    Back pain    Shortness of breath    GERD (gastroesophageal reflux disease)    Dysrhythmia    Hypercholesterolemia    BPH with obstruction/lower urinary tract symptoms    Prostate cancer ([place]) 01/07/2012   Anemia 09/10/2011   Blood in stool 09/10/2011   Diabetes mellitus ([place]) 09/10/2011   HTN (hypertension)  09/10/2011   Past Surgical History:  Procedure Laterality Date   CIRCUMCISION     ESOPHAGOGASTRODUODENOSCOPY  09/11/2011   Procedure: ESOPHAGOGASTRODUODENOSCOPY (EGD);  Surgeon: [name], MD;  Location: [facility] ENDOSCOPY;  Service: Endoscopy;  Laterality: N/A;   ORIF FEMUR FRACTURE Right 02/22/2016   Procedure: OPEN REDUCTION INTERNAL FIXATION (ORIF) DISTAL FEMUR FRACTURE;  Surgeon: [name], MD;  Location: [place];  Service: Orthopedics;  Laterality: Right;   Current Outpatient Medications on File Prior to Visit  Medication Sig Dispense Refill   aspirin 81 MG tablet Take 81 mg by mouth once a week. On Thursdays     atorvastatin (LIPITOR) 20 MG tablet Take 20 mg by mouth daily.     atorvastatin (LIPITOR) 20 MG tablet 1 tablet     Blood Glucose Monitoring Suppl (ACCU-CHEK GUIDE) w/Device KIT Use to check your blood sugar     famotidine (PEPCID) 10 MG tablet 1 tablet as needed     glucose blood (ACCU-CHEK GUIDE) test strip use to check your blood sugar     JARDIANCE 25 MG TABS tablet Take 25 mg by mouth daily.     metFORMIN (GLUCOPHAGE) 1000 MG tablet Take 1,000 mg by mouth 2 (two) times daily.     Multiple Vitamin (MULTIVITAMIN WITH MINERALS) TABS Take 1 tablet by mouth every morning.     pioglitazone (ACTOS) 30 MG tablet 1 tablet     pioglitazone (ACTOS) 30 MG tablet Take  30 mg by mouth daily.     ramipril (ALTACE) 2.5 MG capsule Take 2.5 mg by mouth daily.     vitamin C (ASCORBIC ACID) 500 MG tablet Take 500 mg by mouth daily.     No current facility-administered medications on file prior to visit.    Allergies  Allergen Reactions   Other Other (See Comments)   Penicillins Other (See Comments)    Unknown-reaction years ago   Social History   Occupational History   Occupation: RETIRED  Tobacco Use   Smoking status: Never   Smokeless tobacco: Never  Substance and Sexual Activity   Alcohol use: No   Drug use: No   Sexual activity: Not on file   Family History  Problem  Relation Age of Onset   Diabetes Mother    Cancer Brother        prostate   Cancer Brother        prostate    There is no immunization history on file for this patient.   Review of Systems: Negative except as noted in the HPI.   Objective: There were no vitals filed for this visit.  Christian Estrada is a pleasant 76 y.o. male in NAD. AAO X 3.  Vascular Examination: Capillary refill time immediate b/l.Vascular status intact b/l with palpable pedal pulses. Pedal hair soarse b/l. No edema. No pain with calf compression b/l. Skin temperature gradient WNL b/l.   Neurological Examination: Protective sensation intact 5/5 intact bilaterally with 10g monofilament b/l. Vibratory sensation intact b/l. Proprioception intact bilaterally.  Dermatological Examination: Pedal skin with normal turgor, texture and tone b/l. Toenails 1-5 b/l thick, discolored, elongated with subungual debris and pain on dorsal palpation. No hyperkeratotic nor porokeratotic lesions present on today's visit.  Musculoskeletal Examination: Normal muscle strength 5/5 to all lower extremity muscle groups bilaterally. Hammertoe(s) noted to the bilateral 2nd toes. Pes planus deformity noted bilateral LE.[name] No pain, crepitus or joint limitation noted with ROM b/l LE.  Patient ambulates independently without assistive aids.  Radiographs: None  Footwear Assessment: Does the patient wear appropriate shoes? Yes. Does the patient need inserts/orthotics? No.  ADA Risk Categorization: Low Risk :  Patient has all of the following: Intact protective sensation No prior foot ulcer  No severe deformity Pedal pulses present  Assessment: 1. Pain due to onychomycosis of toenails of both feet   2. Pes planus of both feet   3. Acquired hammertoes of both feet   4. Uncontrolled type 2 diabetes mellitus with hyperglycemia ([place])   5. Encounter for diabetic foot exam ([place])     Plan: -Consent given for treatment as described  below: -Diabetic foot examination performed today. -Continue diabetic foot care principles: inspect feet daily, monitor glucose as recommended by PCP and/or Endocrinologist, and follow prescribed diet per PCP, Endocrinologist and/or dietician. -Mycotic toenails 1-5 bilaterally were debrided in length and girth with sterile nail nippers and dremel without incident. -Patient/POA to call should there be question/concern in the interim. Return in about 3 months (around 03/18/2022).  Marzetta Board, DPM

## 2022-04-06 ENCOUNTER — Ambulatory Visit: Payer: Medicare Other | Admitting: Podiatry

## 2022-04-06 ENCOUNTER — Encounter: Payer: Self-pay | Admitting: Podiatry

## 2022-04-06 VITALS — BP 136/79

## 2022-04-06 DIAGNOSIS — M79674 Pain in right toe(s): Secondary | ICD-10-CM

## 2022-04-06 DIAGNOSIS — E119 Type 2 diabetes mellitus without complications: Secondary | ICD-10-CM

## 2022-04-06 DIAGNOSIS — M79675 Pain in left toe(s): Secondary | ICD-10-CM | POA: Diagnosis not present

## 2022-04-06 DIAGNOSIS — B351 Tinea unguium: Secondary | ICD-10-CM | POA: Diagnosis not present

## 2022-04-06 NOTE — Progress Notes (Unsigned)
  Subjective:  Patient ID: Christian Estrada, male    DOB: 07-Nov-1945,  MRN: [id number]  Christian Estrada presents to clinic today for {jgcomplaint:23593}  Chief Complaint  Patient presents with   Nail Problem    [facility] BS-153 A1C-"less than 7" PCP-Polite PCP VST-10/2021   New problem(s): None. {jgcomplaint:23593}  PCP is [name], MD.  Allergies  Allergen Reactions   Other Other (See Comments)   Penicillins Other (See Comments)    Unknown-reaction years ago    Review of Systems: Negative except as noted in the HPI.  Objective: No changes noted in today's physical examination. Vitals:   04/06/22 0832  BP: 136/79   Christian Estrada is a pleasant 77 y.o. male {jgbodyhabitus:24098} AAO x 3.  Vascular Examination: Capillary refill time immediate b/l.Vascular status intact b/l with palpable pedal pulses. Pedal hair soarse b/l. No edema. No pain with calf compression b/l. Skin temperature gradient WNL b/l.   Neurological Examination: Protective sensation intact 5/5 intact bilaterally with 10g monofilament b/l. Vibratory sensation intact b/l. Proprioception intact bilaterally.  Dermatological Examination: Pedal skin with normal turgor, texture and tone b/l. Toenails 1-5 b/l thick, discolored, elongated with subungual debris and pain on dorsal palpation. No hyperkeratotic nor porokeratotic lesions present on today's visit.  Musculoskeletal Examination: Normal muscle strength 5/5 to all lower extremity muscle groups bilaterally. Hammertoe(s) noted to the bilateral 2nd toes. Pes planus deformity noted bilateral LE. No pain, crepitus or joint limitation noted with ROM b/l LE.  Patient ambulates independently without assistive aids.  Radiographs: None  Assessment/Plan: 1. Pain due to onychomycosis of toenails of both feet   2. Type 2 diabetes mellitus without complication, without long-term current use of insulin ([facility])     No orders of the defined types were placed in this  encounter.   None {Jgplan:23602::"-Patient/POA to call should there be question/concern in the interim."}   Return in about 3 months (around 07/07/2022).  Marzetta Board, DPM

## 2022-08-05 ENCOUNTER — Ambulatory Visit: Payer: Medicare Other | Admitting: Podiatry

## 2022-08-05 DIAGNOSIS — M79675 Pain in left toe(s): Secondary | ICD-10-CM

## 2022-08-05 DIAGNOSIS — B351 Tinea unguium: Secondary | ICD-10-CM

## 2022-08-05 DIAGNOSIS — M79674 Pain in right toe(s): Secondary | ICD-10-CM | POA: Diagnosis not present

## 2022-08-05 DIAGNOSIS — E119 Type 2 diabetes mellitus without complications: Secondary | ICD-10-CM

## 2022-08-05 NOTE — Progress Notes (Signed)
  Subjective:  Patient ID: Christian Estrada, male    DOB: Jan 04, 1946,  MRN: [id number]  CURRIE HEDRICK presents to clinic today for preventative diabetic foot care and painful thick toenails that are difficult to trim. Pain interferes with ambulation. Aggravating factors include wearing enclosed shoe gear. Pain is relieved with periodic professional debridement.  Chief Complaint  Patient presents with   Diabetes    DFC BS - DIDN'T CHECK IT THIS MORNING  A1C - DK  LVPCP - 04/2022   New problem(s): None.   PCP is Renford Dills, MD.  Allergies  Allergen Reactions   Other Other (See Comments)   Penicillins Other (See Comments)    Unknown-reaction years ago    Review of Systems: Negative except as noted in the HPI.  Objective: No changes noted in today's physical examination. There were no vitals filed for this visit. [name] is a pleasant 77 y.o. male in NAD. AAO x 3.  Vascular Examination: Capillary refill time immediate b/l.Vascular status intact b/l with palpable pedal pulses. Pedal hair soarse b/l. No edema. No pain with calf compression b/l. Skin temperature gradient WNL b/l.   Neurological Examination: Protective sensation intact 5/5 intact bilaterally with 10g monofilament b/l. Vibratory sensation intact b/l. Proprioception intact bilaterally.  Dermatological Examination: Pedal skin with normal turgor, texture and tone b/l. Toenails 1-5 b/l thick, discolored, elongated with subungual debris and pain on dorsal palpation. No hyperkeratotic nor porokeratotic lesions present on today's visit.  Incurvated nailplate right great toe medial border(s) with tenderness to palpation. No erythema, no edema, no drainage noted.   Musculoskeletal Examination: Normal muscle strength 5/5 to all lower extremity muscle groups bilaterally. Hammertoe(s) noted to the bilateral 2nd toes. Pes planus deformity noted bilateral LE. No pain, crepitus or joint limitation noted with ROM b/l  LE.  Patient ambulates with cane assistance.  Radiographs: None  Assessment/Plan: 1. Pain due to onychomycosis of toenails of both feet   2. Type 2 diabetes mellitus without complication, without long-term current use of insulin (HCC)    Patient was evaluated and treated. All patient's and/or POA's questions/concerns addressed on today's visit. Toenails 1-5 debrided in length and girth without incident. Continue soft, supportive shoe gear daily. Report any pedal injuries to medical professional. Call office if there are any questions/concerns. -No invasive procedure(s) performed. Offending nail border debrided and curretaged right great toe utilizing sterile nail nipper and currette. Border(s) cleansed with alcohol and triple antibiotic ointment applied. Patient/POA/Caregiver/Facility instructed to apply Neosporin Cream  to right great toe once daily for 7 days. Call office if there are any concerns. -Patient/POA to call should there be question/concern in the interim.   Return in about 3 months (around 11/05/2022).  Freddie Breech, DPM

## 2022-08-10 ENCOUNTER — Encounter: Payer: Self-pay | Admitting: Podiatry

## 2022-11-11 ENCOUNTER — Ambulatory Visit: Payer: Medicare Other | Admitting: Podiatry

## 2022-11-11 ENCOUNTER — Encounter: Payer: Self-pay | Admitting: Podiatry

## 2022-11-11 DIAGNOSIS — E119 Type 2 diabetes mellitus without complications: Secondary | ICD-10-CM | POA: Diagnosis not present

## 2022-11-11 DIAGNOSIS — M79675 Pain in left toe(s): Secondary | ICD-10-CM

## 2022-11-11 DIAGNOSIS — M79674 Pain in right toe(s): Secondary | ICD-10-CM | POA: Diagnosis not present

## 2022-11-11 DIAGNOSIS — B351 Tinea unguium: Secondary | ICD-10-CM | POA: Diagnosis not present

## 2022-11-11 NOTE — Progress Notes (Signed)
Subjective:  Patient ID: Christian Estrada, male    DOB: 05-Sep-1945,  MRN: [id number]  77 y.o. male presents to clinic with  preventative diabetic foot care and painful elongated mycotic toenails 1-5 bilaterally which are tender when wearing enclosed shoe gear. Pain is relieved with periodic professional debridement.  Chief Complaint  Patient presents with   Diabetes    DFC BS - ONLY CHECKS IT ONCE A WEEK A1C - DK  LVPCP - 10/2022    New problem(s): None   PCP is Renford Dills, MD.  Allergies  Allergen Reactions   Other Other (See Comments)   Penicillins Other (See Comments)    Unknown-reaction years ago    Review of Systems: Negative except as noted in the HPI.   Objective:  Christian Estrada is a pleasant 77 y.o. male in NAD.[name]  Vascular Examination: Vascular status intact b/l with palpable pedal pulses. CFT immediate b/l. No edema. No pain with calf compression b/l. Skin temperature gradient WNL b/l. No cyanosis or clubbing noted b/l LE.  Neurological Examination: Sensation grossly intact b/l with 10 gram monofilament. Vibratory sensation intact b/l.   Dermatological Examination: Pedal skin with normal turgor, texture and tone b/l. Toenails 1-5 b/l thick, discolored, elongated with subungual debris and pain on dorsal palpation. No hyperkeratotic lesions noted b/l.   Musculoskeletal Examination: Muscle strength 5/5 to b/l LE. Pes planus deformity noted bilateral LE. Utilizes cane for ambulation assistance.  Radiographs: None  Assessment:   1. Pain due to onychomycosis of toenails of both feet   2. Type 2 diabetes mellitus without complication, without long-term current use of insulin (HCC)    Plan:  -Consent given for treatment as described below: -Examined patient. -Continue supportive shoe gear daily. -Mycotic toenails 1-5 bilaterally were debrided in length and girth with sterile nail nippers and dremel without incident. -Patient/POA to call should there be  question/concern in the interim.  Return in about 3 months (around 02/11/2023).  Freddie Breech, DPM

## 2023-02-24 ENCOUNTER — Ambulatory Visit (INDEPENDENT_AMBULATORY_CARE_PROVIDER_SITE_OTHER): Payer: Medicare Other | Admitting: Podiatry

## 2023-02-24 DIAGNOSIS — Z91198 Patient's noncompliance with other medical treatment and regimen for other reason: Secondary | ICD-10-CM

## 2023-02-24 NOTE — Progress Notes (Signed)
1. Failure to attend appointment with reason given    Canceled.

## 2023-03-03 ENCOUNTER — Ambulatory Visit: Payer: Medicare Other | Admitting: Podiatry

## 2023-03-03 ENCOUNTER — Encounter: Payer: Self-pay | Admitting: Podiatry

## 2023-03-03 VITALS — Ht 71.0 in

## 2023-03-03 DIAGNOSIS — B351 Tinea unguium: Secondary | ICD-10-CM | POA: Diagnosis not present

## 2023-03-03 DIAGNOSIS — M2142 Flat foot [pes planus] (acquired), left foot: Secondary | ICD-10-CM | POA: Diagnosis not present

## 2023-03-03 DIAGNOSIS — M79674 Pain in right toe(s): Secondary | ICD-10-CM

## 2023-03-03 DIAGNOSIS — M79675 Pain in left toe(s): Secondary | ICD-10-CM

## 2023-03-03 DIAGNOSIS — M2141 Flat foot [pes planus] (acquired), right foot: Secondary | ICD-10-CM

## 2023-03-03 DIAGNOSIS — E119 Type 2 diabetes mellitus without complications: Secondary | ICD-10-CM | POA: Diagnosis not present

## 2023-03-07 NOTE — Progress Notes (Signed)
ANNUAL DIABETIC FOOT EXAM  Subjective: Christian Estrada presents today for annual diabetic foot exam.  Chief Complaint  Patient presents with   RFC    He is here for a nail trim, Pcp is Dr. Nehemiah Settle seen about 6 months, " last A1C was 6 something"    Patient confirms h/o diabetes.  Patient denies any h/o foot wounds.  Renford Dills, MD is patient's PCP.  Past Medical History:  Diagnosis Date   Allergy    pcn   Back pain    BPH with obstruction/lower urinary tract symptoms    Diabetes mellitus    Dysrhythmia    ED (erectile dysfunction)    GERD (gastroesophageal reflux disease)    Hypercholesterolemia    Hypertension    Prostate cancer (HCC) 01/07/12   Adenocarcinoma, gleason 4+3=7,& 3+3=6,& 3+4=7,PSA=6.08,Volume=18.95cc   Radiation 04/07/12-06/03/12   7800 cGy,seminal vesicles 5600 cGy   Reflux    Shortness of breath    Patient Active Problem List   Diagnosis Date Noted   Type II diabetes mellitus, uncontrolled 01/19/2020   Aortic stenosis, mild 01/19/2020   Aortic valve disorder 01/19/2020   Cardiomegaly 01/19/2020   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   Morbid obesity (HCC) 01/19/2020   Overweight 01/19/2020   Heart murmur 01/19/2020   Postoperative anemia due to acute blood loss 02/23/2016   Femur fracture, right (HCC) 02/21/2016   Reflux    Hypertension    Back pain    Shortness of breath    GERD (gastroesophageal reflux disease)    Dysrhythmia    Hypercholesterolemia    BPH with obstruction/lower urinary tract symptoms    Prostate cancer (HCC) 01/07/2012   Anemia 09/10/2011   Blood in stool 09/10/2011   Diabetes mellitus (HCC) 09/10/2011   HTN (hypertension) 09/10/2011   Past Surgical History:  Procedure Laterality Date   CIRCUMCISION     ESOPHAGOGASTRODUODENOSCOPY  09/11/2011   Procedure: ESOPHAGOGASTRODUODENOSCOPY (EGD);  Surgeon: Barrie Folk, MD;  Location: [facility] ENDOSCOPY;  Service:  Endoscopy;  Laterality: N/A;   ORIF FEMUR FRACTURE Right 02/22/2016   Procedure: OPEN REDUCTION INTERNAL FIXATION (ORIF) DISTAL FEMUR FRACTURE;  Surgeon: Tarry Kos, MD;  Location: [facility] OR;  Service: Orthopedics;  Laterality: Right;   Current Outpatient Medications on File Prior to Visit  Medication Sig Dispense Refill   aspirin 81 MG tablet Take 81 mg by mouth once a week. On Thursdays     atorvastatin (LIPITOR) 20 MG tablet Take 20 mg by mouth daily.     atorvastatin (LIPITOR) 20 MG tablet 1 tablet     Blood Glucose Monitoring Suppl (ACCU-CHEK GUIDE) w/Device KIT Use to check your blood sugar     famotidine (PEPCID) 10 MG tablet 1 tablet as needed     glucose blood (ACCU-CHEK GUIDE) test strip use to check your blood sugar     JARDIANCE 25 MG TABS tablet Take 25 mg by mouth daily.     metFORMIN (GLUCOPHAGE) 1000 MG tablet Take 1,000 mg by mouth 2 (two) times daily.     Multiple Vitamin (MULTIVITAMIN WITH MINERALS) TABS Take 1 tablet by mouth every morning.     pioglitazone (ACTOS) 30 MG tablet 1 tablet     pioglitazone (ACTOS) 30 MG tablet Take 30 mg by mouth daily.     ramipril (ALTACE) 2.5 MG capsule Take 2.5 mg by mouth daily.     vitamin C (ASCORBIC ACID) 500 MG tablet Take 500  mg by mouth daily.     No current facility-administered medications on file prior to visit.    Allergies  Allergen Reactions   Other Other (See Comments)   Penicillins Other (See Comments)    Unknown-reaction years ago   Social History   Occupational History   Occupation: RETIRED  Tobacco Use   Smoking status: Never   Smokeless tobacco: Never  Substance and Sexual Activity   Alcohol use: No   Drug use: No   Sexual activity: Not on file   Family History  Problem Relation Age of Onset   Diabetes Mother    Cancer Brother        prostate   Cancer Brother        prostate    There is no immunization history on file for this patient.   Review of Systems: Negative except as noted in the HPI.    Objective: There were no vitals filed for this visit.  Christian Estrada is a pleasant 78 y.o. male in NAD. AAO X 3.  Title   Diabetic Foot Exam - detailed Date & Time: 03/03/2023  3:15 PM Diabetic Foot exam was performed with the following findings: Yes  Visual Foot Exam completed.: Yes  Is there a history of foot ulcer?: No Is there a foot ulcer now?: No Is there swelling?: No Is there elevated skin temperature?: No Is there abnormal foot shape?: No Is there a claw toe deformity?: No Are the toenails long?: Yes Are the toenails thick?: Yes Are the toenails ingrown?: No Is the skin thin, fragile, shiny and hairless?": No Normal Range of Motion?: Yes Is there foot or ankle muscle weakness?: No Do you have pain in calf while walking?: No Are the shoes appropriate in style and fit?: Yes Can the patient see the bottom of their feet?: No Pulse Foot Exam completed.: Yes   Right Posterior Tibialis: Present Left posterior Tibialis: Present   Right Dorsalis Pedis: Present Left Dorsalis Pedis: Present     Sensory Foot Exam Completed.: Yes Semmes-Weinstein Monofilament Test "+" means "has sensation" and "-" means "no sensation"   R Site 1-Great Toe: Pos L Site 1-Great Toe: Pos   R Site 4: Pos L Site 4: Pos   R site 5: Pos L Site 5: Pos  R Site 6: Pos L Site 6: Pos     Image components are not supported.   Image components are not supported. Image components are not supported.  [place] Right vibratory: present Left vibratory: present  Comments Pes planus foot deformity.     Lab Results  Component Value Date   HGBA1C 5.6 02/21/2016   ADA Risk Categorization: Low Risk :  Patient has all of the following: Intact protective sensation No prior foot ulcer  No severe deformity Pedal pulses present  Assessment: 1. Pain due to onychomycosis of toenails of both feet   2. Pes planus of both feet   3. Type 2 diabetes mellitus without complication, without long-term  current use of insulin (HCC)   4. Encounter for diabetic foot exam (HCC)     Plan: -Consent given for treatment as described below: -Examined patient. -Diabetic foot examination performed today. -Continue diabetic foot care principles: inspect feet daily, monitor glucose as recommended by PCP and/or Endocrinologist, and follow prescribed diet per PCP, Endocrinologist and/or dietician. -Patient to continue soft, supportive shoe gear daily. -Toenails 1-5 b/l were debrided in length and girth with sterile nail nippers and dremel without iatrogenic bleeding.  -  Patient/POA to call should there be question/concern in the interim. Return in about 3 months (around 06/01/2023).  Freddie Breech, DPM      [place] LOCATION: [address] [place] [address]                   Office [phone]   [facility] LOCATION: [address] [place], [place] [address] Office [phone]

## 2023-04-20 ENCOUNTER — Other Ambulatory Visit (HOSPITAL_COMMUNITY): Payer: Self-pay | Admitting: Internal Medicine

## 2023-04-20 DIAGNOSIS — I350 Nonrheumatic aortic (valve) stenosis: Secondary | ICD-10-CM

## 2023-05-19 ENCOUNTER — Ambulatory Visit (HOSPITAL_COMMUNITY): Attending: Cardiology

## 2023-05-19 DIAGNOSIS — I350 Nonrheumatic aortic (valve) stenosis: Secondary | ICD-10-CM | POA: Diagnosis present

## 2023-05-19 LAB — ECHOCARDIOGRAM COMPLETE
AR max vel: 1.32 cm2
AV Area VTI: 1.1 cm2
AV Area mean vel: 1.18 cm2
AV Mean grad: 12 mmHg
AV Peak grad: 18.8 mmHg
Ao pk vel: 2.17 m/s
S' Lateral: 2.9 cm

## 2023-05-19 MED ORDER — PERFLUTREN LIPID MICROSPHERE
1.0000 mL | INTRAVENOUS | Status: AC | PRN
  Administered 2023-05-19: 2 mL via INTRAVENOUS

## 2023-07-07 ENCOUNTER — Encounter: Payer: Self-pay | Admitting: Podiatry

## 2023-07-07 ENCOUNTER — Ambulatory Visit: Payer: Medicare Other | Admitting: Podiatry

## 2023-07-07 DIAGNOSIS — M79675 Pain in left toe(s): Secondary | ICD-10-CM

## 2023-07-07 DIAGNOSIS — E119 Type 2 diabetes mellitus without complications: Secondary | ICD-10-CM | POA: Diagnosis not present

## 2023-07-07 DIAGNOSIS — B351 Tinea unguium: Secondary | ICD-10-CM

## 2023-07-07 DIAGNOSIS — M79674 Pain in right toe(s): Secondary | ICD-10-CM

## 2023-07-13 ENCOUNTER — Encounter: Payer: Self-pay | Admitting: Podiatry

## 2023-07-13 NOTE — Progress Notes (Signed)
  Subjective:  Patient ID: Christian Estrada, male    DOB: 06/24/1945,  MRN: [id number]  78 y.o. male presents preventative diabetic foot care and painful elongated mycotic toenails 1-5 bilaterally which are tender when wearing enclosed shoe gear. Pain is relieved with periodic professional debridement.  Chief Complaint  Patient presents with   Dfc    Rm16/ diabetic/ blood sugar 134/ A1c/ Last visit pcp March 2025    New problem(s): None   PCP is Merl Star, MD.  Allergies  Allergen Reactions   Other Other (See Comments)   Penicillins Other (See Comments)    Unknown-reaction years ago   Review of Systems: Negative except as noted in the HPI.   Objective:  MEDHANSH Estrada is a pleasant 78 y.o. male WD, WN in NAD. AAO x 3.  Vascular Examination: Vascular status intact b/l with palpable pedal pulses. CFT immediate b/l. Pedal hair present. No edema. No pain with calf compression b/l. Skin temperature gradient WNL b/l. No varicosities noted. No cyanosis or clubbing noted.  Neurological Examination: Sensation grossly intact b/l with 10 gram monofilament. Vibratory sensation intact b/l.  Dermatological Examination: Pedal skin with normal turgor, texture and tone b/l. No open wounds nor interdigital macerations noted. Toenails 1-5 b/l thick, discolored, elongated with subungual debris and pain on dorsal palpation. No hyperkeratotic lesions noted b/l.   Musculoskeletal Examination: Muscle strength 5/5 to b/l LE.  No pain, crepitus noted b/l. Pes planus deformity noted bilateral LE.  Radiographs: None  Last A1c:       No data to display         Assessment:   1. Pain due to onychomycosis of toenails of both feet   2. Type 2 diabetes mellitus without complication, without long-term current use of insulin  (HCC)     Plan:  Patient was evaluated and treated. All patient's and/or POA's questions/concerns addressed on today's visit. Mycotic toenails 1-5 debrided in length and  girth without incident.  Continue daily foot inspections and monitor blood glucose per PCP/Endocrinologist's recommendations.Continue soft, supportive shoe gear daily. Report any pedal injuries to medical professional. Call office if there are any quesitons/concerns. -Patient/POA to call should there be question/concern in the interim.  Return in about 3 months (around 10/07/2023).  Luella Sager, DPM      [place] LOCATION: [address], [place] [address]                   Office [phone]   [facility] LOCATION: [address] [place], [place] [address] Office [phone]

## 2023-10-20 ENCOUNTER — Ambulatory Visit: Admitting: Podiatry

## 2023-10-20 ENCOUNTER — Encounter: Payer: Self-pay | Admitting: Podiatry

## 2023-10-20 DIAGNOSIS — E119 Type 2 diabetes mellitus without complications: Secondary | ICD-10-CM | POA: Diagnosis not present

## 2023-10-20 DIAGNOSIS — B351 Tinea unguium: Secondary | ICD-10-CM | POA: Diagnosis not present

## 2023-10-20 DIAGNOSIS — M79674 Pain in right toe(s): Secondary | ICD-10-CM | POA: Diagnosis not present

## 2023-10-20 DIAGNOSIS — M79675 Pain in left toe(s): Secondary | ICD-10-CM | POA: Diagnosis not present

## 2023-10-20 NOTE — Progress Notes (Signed)
  Subjective:  Patient ID: Christian Estrada, male    DOB: [birth year],  MRN: [id number]  [name] presents to clinic today for preventative diabetic foot care for painful thick toenails that are difficult to trim. Pain interferes with ambulation. Aggravating factors include wearing enclosed shoe gear. Pain is relieved with periodic professional debridement.  Chief Complaint  Patient presents with   Diabetes    DFC NIDDM A1C 5.6. LOV with PCP 04/20/23. Toenail trim.   New problem(s): None.   PCP is Rexanne Ingle, MD.  Allergies  Allergen Reactions   Other Other (See Comments)   Penicillins Other (See Comments)    Unknown-reaction years ago    Review of Systems: Negative except as noted in the HPI.  Objective: No changes noted in today's physical examination. There were no vitals filed for this visit. Christian Estrada is a pleasant 78 y.o. male in NAD. AAO x 3.  Vascular Examination: Vascular status intact b/l with palpable pedal pulses. CFT immediate b/l. Pedal hair present. No edema. No pain with calf compression b/l. Skin temperature gradient WNL b/l. No varicosities noted. No cyanosis or clubbing noted.  Neurological Examination: Sensation grossly intact b/l with 10 gram monofilament. Vibratory sensation intact b/l.  Dermatological Examination: Pedal skin with normal turgor, texture and tone b/l. No open wounds nor interdigital macerations noted. Toenails 1-5 b/l thick, discolored, elongated with subungual debris and pain on dorsal palpation. No hyperkeratotic lesions noted b/l.   Musculoskeletal Examination: Muscle strength 5/5 to b/l LE.  No pain, crepitus noted b/l. Pes planus deformity noted bilateral LE.  Radiographs: None  Assessment/Plan: 1. Pain due to onychomycosis of toenails of both feet   2. Type 2 diabetes mellitus without complication, without long-term current use of insulin  [facility])     Consent given for treatment. Patient examined. All patient's  and/or POA's questions/concerns addressed on today's visit. Toenails 1-5 debrided in length and girth without incident. Treatment was provided by assistant Mable FABIENE Cherry under my supervision. Continue foot and shoe inspections daily. Monitor blood glucose per PCP/Endocrinologist's recommendations. Continue soft, supportive shoe gear daily. Report any pedal injuries to medical professional. Call office if there are any questions/concerns.  Return in about 3 months (around 01/19/2024).  Delon LITTIE Merlin, DPM      [place] LOCATION: [address] [place] [address]                   Office [phone]   [facility] LOCATION: [address] [place], [place] [address] Office [phone]

## 2024-02-08 ENCOUNTER — Ambulatory Visit (INDEPENDENT_AMBULATORY_CARE_PROVIDER_SITE_OTHER): Admitting: Podiatry

## 2024-02-08 ENCOUNTER — Encounter: Payer: Self-pay | Admitting: Podiatry

## 2024-02-08 DIAGNOSIS — M79675 Pain in left toe(s): Secondary | ICD-10-CM

## 2024-02-08 DIAGNOSIS — E119 Type 2 diabetes mellitus without complications: Secondary | ICD-10-CM | POA: Diagnosis not present

## 2024-02-08 DIAGNOSIS — I517 Cardiomegaly: Secondary | ICD-10-CM | POA: Insufficient documentation

## 2024-02-08 DIAGNOSIS — E1136 Type 2 diabetes mellitus with diabetic cataract: Secondary | ICD-10-CM | POA: Insufficient documentation

## 2024-02-08 DIAGNOSIS — Z1211 Encounter for screening for malignant neoplasm of colon: Secondary | ICD-10-CM | POA: Insufficient documentation

## 2024-02-08 DIAGNOSIS — B351 Tinea unguium: Secondary | ICD-10-CM | POA: Diagnosis not present

## 2024-02-08 DIAGNOSIS — M79674 Pain in right toe(s): Secondary | ICD-10-CM | POA: Diagnosis not present

## 2024-02-08 NOTE — Progress Notes (Signed)
"  °  Subjective:  Patient ID: Christian Estrada, male    DOB: 06/14/1945,  MRN: [id number]  LOC FEINSTEIN presents to clinic today for preventative diabetic foot care for painful mycotic toenails of both feet that are difficult to trim. Pain interferes with daily activities and wearing enclosed shoe gear comfortably.  Chief Complaint  Patient presents with   [facility]    Rm17 Diabetic foot care/ Dr. Tanda Bame last visit September 2025/ Last A1c 5.6   New problem(s): None.   PCP is Bame Tanda, MD.  Allergies[1]  Review of Systems: Negative except as noted in the HPI.  Objective: No changes noted in today's physical examination. There were no vitals filed for this visit. KASE SHUGHART is a pleasant 79 y.o. male in NAD. AAO x 3.  Vascular Examination: Vascular status intact b/l with palpable pedal pulses. CFT immediate b/l. Pedal hair present. No edema. No pain with calf compression b/l. Skin temperature gradient WNL b/l. No varicosities noted. No cyanosis or clubbing noted.  Neurological Examination: Sensation grossly intact b/l with 10 gram monofilament. Vibratory sensation intact b/l.  Dermatological Examination: Pedal skin with normal turgor, texture and tone b/l. No open wounds nor interdigital macerations noted. Toenails 1-5 b/l thick, discolored, elongated with subungual debris and pain on dorsal palpation. No hyperkeratotic lesions noted b/l.   Musculoskeletal Examination: Muscle strength 5/5 to b/l LE.  No pain, crepitus noted b/l. Pes planus deformity noted bilateral LE. Utilizes cane for ambulation assistance.  Radiographs: None  Assessment/Plan: 1. Pain due to onychomycosis of toenails of both feet   2. Type 2 diabetes mellitus without complication, without long-term current use of insulin  [facility])   Patient was evaluated and treated. All patient's and/or POA's questions/concerns addressed on today's visit. Mycotic toenails 1-5 b/l debrided in length and girth without  incident.  Continue daily foot inspections and monitor blood glucose per PCP/Endocrinologist's recommendations.Continue soft, supportive shoe gear daily. Report any pedal injuries to medical professional. Call office if there are any quesitons/concerns. -Patient/POA to call should there be question/concern in the interim.   Return in about 3 months (around 05/08/2024).  Delon LITTIE Merlin, DPM      [place] LOCATION: [address] [place] [address]                   Office [phone]   [place] LOCATION: [address] [place], [place] [address] Office [phone]      [1]  Allergies Allergen Reactions   Other Other (See Comments)   Penicillins Other (See Comments)    Unknown-reaction years ago   "

## 2024-05-16 ENCOUNTER — Ambulatory Visit: Admitting: Podiatry
